# Patient Record
Sex: Female | Born: 1955 | Race: Black or African American | Hispanic: No | Marital: Married | Smoking: Never smoker
Health system: Southern US, Community
[De-identification: ages and names within clinical notes are randomized; demographics above are authoritative.]

## PROBLEM LIST (undated history)

## (undated) DIAGNOSIS — I639 Cerebral infarction, unspecified: Secondary | ICD-10-CM

## (undated) DIAGNOSIS — I10 Essential (primary) hypertension: Secondary | ICD-10-CM

## (undated) HISTORY — DX: Essential (primary) hypertension: I10

## (undated) HISTORY — DX: Cerebral infarction, unspecified: I63.9

## (undated) HISTORY — PX: PARTIAL HYSTERECTOMY: SHX80

---

## 2008-04-11 ENCOUNTER — Ambulatory Visit (HOSPITAL_COMMUNITY): Admission: RE | Admit: 2008-04-11 | Discharge: 2008-04-11 | Payer: Self-pay | Admitting: Internal Medicine

## 2011-07-28 ENCOUNTER — Other Ambulatory Visit (HOSPITAL_COMMUNITY): Payer: Self-pay | Admitting: Internal Medicine

## 2011-07-28 DIAGNOSIS — Z139 Encounter for screening, unspecified: Secondary | ICD-10-CM

## 2011-08-04 ENCOUNTER — Ambulatory Visit (HOSPITAL_COMMUNITY)
Admission: RE | Admit: 2011-08-04 | Discharge: 2011-08-04 | Disposition: A | Discharge status: Home / Self Care | Payer: PRIVATE HEALTH INSURANCE | Source: Ambulatory Visit | Attending: Internal Medicine | Admitting: Internal Medicine

## 2011-08-04 DIAGNOSIS — Z1231 Encounter for screening mammogram for malignant neoplasm of breast: Secondary | ICD-10-CM | POA: Insufficient documentation

## 2011-08-04 DIAGNOSIS — Z139 Encounter for screening, unspecified: Secondary | ICD-10-CM

## 2011-08-15 ENCOUNTER — Other Ambulatory Visit: Payer: Self-pay | Admitting: Internal Medicine

## 2011-08-15 DIAGNOSIS — R928 Other abnormal and inconclusive findings on diagnostic imaging of breast: Secondary | ICD-10-CM

## 2011-08-19 ENCOUNTER — Other Ambulatory Visit: Payer: Self-pay | Admitting: Internal Medicine

## 2011-08-19 DIAGNOSIS — R928 Other abnormal and inconclusive findings on diagnostic imaging of breast: Secondary | ICD-10-CM

## 2011-09-02 ENCOUNTER — Ambulatory Visit
Admission: RE | Admit: 2011-09-02 | Discharge: 2011-09-02 | Disposition: A | Discharge status: Home / Self Care | Payer: PRIVATE HEALTH INSURANCE | Source: Ambulatory Visit | Attending: Internal Medicine | Admitting: Internal Medicine

## 2011-09-02 DIAGNOSIS — R928 Other abnormal and inconclusive findings on diagnostic imaging of breast: Secondary | ICD-10-CM

## 2012-08-31 ENCOUNTER — Other Ambulatory Visit: Payer: Self-pay | Admitting: Internal Medicine

## 2012-08-31 DIAGNOSIS — N6310 Unspecified lump in the right breast, unspecified quadrant: Secondary | ICD-10-CM

## 2012-09-09 ENCOUNTER — Ambulatory Visit
Admission: RE | Admit: 2012-09-09 | Discharge: 2012-09-09 | Disposition: A | Discharge status: Home / Self Care | Payer: PRIVATE HEALTH INSURANCE | Source: Ambulatory Visit | Attending: Internal Medicine | Admitting: Internal Medicine

## 2012-09-09 DIAGNOSIS — N6310 Unspecified lump in the right breast, unspecified quadrant: Secondary | ICD-10-CM

## 2015-03-06 ENCOUNTER — Ambulatory Visit (HOSPITAL_COMMUNITY)
Admission: RE | Admit: 2015-03-06 | Discharge: 2015-03-06 | Disposition: A | Discharge status: Home / Self Care | Payer: PRIVATE HEALTH INSURANCE | Source: Ambulatory Visit | Attending: Internal Medicine | Admitting: Internal Medicine

## 2015-03-06 ENCOUNTER — Other Ambulatory Visit (HOSPITAL_COMMUNITY): Payer: Self-pay | Admitting: Internal Medicine

## 2015-03-06 DIAGNOSIS — M25572 Pain in left ankle and joints of left foot: Secondary | ICD-10-CM | POA: Diagnosis not present

## 2015-03-06 DIAGNOSIS — R609 Edema, unspecified: Secondary | ICD-10-CM | POA: Diagnosis not present

## 2015-03-06 DIAGNOSIS — M259 Joint disorder, unspecified: Secondary | ICD-10-CM

## 2015-11-15 ENCOUNTER — Other Ambulatory Visit: Payer: Self-pay | Admitting: Internal Medicine

## 2015-11-15 DIAGNOSIS — N63 Unspecified lump in unspecified breast: Secondary | ICD-10-CM

## 2015-11-15 DIAGNOSIS — Z1231 Encounter for screening mammogram for malignant neoplasm of breast: Secondary | ICD-10-CM

## 2015-11-23 ENCOUNTER — Ambulatory Visit
Admission: RE | Admit: 2015-11-23 | Discharge: 2015-11-23 | Disposition: A | Discharge status: Home / Self Care | Payer: PRIVATE HEALTH INSURANCE | Source: Ambulatory Visit | Attending: Internal Medicine | Admitting: Internal Medicine

## 2015-11-23 DIAGNOSIS — N63 Unspecified lump in unspecified breast: Secondary | ICD-10-CM

## 2016-09-10 ENCOUNTER — Other Ambulatory Visit (HOSPITAL_COMMUNITY): Payer: Self-pay | Admitting: Internal Medicine

## 2016-09-10 DIAGNOSIS — K7689 Other specified diseases of liver: Secondary | ICD-10-CM

## 2016-09-18 ENCOUNTER — Ambulatory Visit (HOSPITAL_COMMUNITY)
Admission: RE | Admit: 2016-09-18 | Discharge: 2016-09-18 | Disposition: A | Discharge status: Home / Self Care | Payer: PRIVATE HEALTH INSURANCE | Source: Ambulatory Visit | Attending: Internal Medicine | Admitting: Internal Medicine

## 2016-09-18 DIAGNOSIS — K7689 Other specified diseases of liver: Secondary | ICD-10-CM

## 2016-10-02 ENCOUNTER — Encounter (INDEPENDENT_AMBULATORY_CARE_PROVIDER_SITE_OTHER): Payer: Self-pay | Admitting: Internal Medicine

## 2016-10-08 ENCOUNTER — Ambulatory Visit (INDEPENDENT_AMBULATORY_CARE_PROVIDER_SITE_OTHER): Payer: PRIVATE HEALTH INSURANCE | Admitting: Internal Medicine

## 2016-10-08 ENCOUNTER — Encounter (INDEPENDENT_AMBULATORY_CARE_PROVIDER_SITE_OTHER): Payer: Self-pay | Admitting: Internal Medicine

## 2016-10-08 VITALS — BP 162/92 | HR 64 | Temp 97.7°F | Ht 62.0 in | Wt 153.3 lb

## 2016-10-08 DIAGNOSIS — R748 Abnormal levels of other serum enzymes: Secondary | ICD-10-CM | POA: Diagnosis not present

## 2016-10-08 DIAGNOSIS — I10 Essential (primary) hypertension: Secondary | ICD-10-CM

## 2016-10-08 HISTORY — DX: Essential (primary) hypertension: I10

## 2016-10-08 NOTE — Patient Instructions (Signed)
Hepatic function in 2 weeks. 

## 2016-10-08 NOTE — Progress Notes (Signed)
   Subjective:    Patient ID: Katrina Hodge, female    DOB: 09/26/1956, 61 y.o.   MRN: [id number]  HPI   Referred by Dr. Sherwood GamblerFusco for elevated liver enzymes. She denies past hx of elevated liver enzymes. She is not exercising. She is ready to start back exercising.  She has done Yoga in the past.  Liver enzymes slightly elevated with negative hepatitis panel.   09/09/2016 AST 52, ALT 34, ALP 79, total bili 0.6 Iron 91, ferritin 125, TIBC 307, vitamin B12 585, UIBC 216. Acute Hepatitis Panel negative. Albumin 4.9 Hemoglobin 14.9  09/18/2016 US abdomen:    IMPRESSION: Diffusely echogenic liver consistent with hepatic parenchymal disease. This could be steatosis or other hepatocellular pathology. No evidence of focal lesion or ductal dilatation. No evidence of gallbladder disease.   Right kidney smaller than the left, presumably chronic.         Review of Systems Past Medical History:  Diagnosis Date  . Essential hypertension 10/08/2016    Past Surgical History:  Procedure Laterality Date  . PARTIAL HYSTERECTOMY     fibroid tumors    Allergies  Allergen Reactions  . Levaquin [Levofloxacin In D5w]     rash    No current outpatient prescriptions on file prior to visit.   No current facility-administered medications on file prior to visit.    Current Outpatient Prescriptions  Medication Sig Dispense Refill  . atenolol (TENORMIN) 100 MG tablet Take 100 mg by mouth daily.    Marland Kitchen. ibuprofen (ADVIL,MOTRIN) 200 MG tablet Take 200 mg by mouth every 6 (six) hours as needed.    . lactobacillus acidophilus (BACID) TABS tablet Take 2 tablets by mouth 3 (three) times daily.    . Multiple Vitamin (MULTIVITAMIN) tablet Take 1 tablet by mouth as needed.     No current facility-administered medications for this visit.         Objective:   Physical Exam Blood pressure (!) 162/92, pulse 64, temperature 97.7 F (36.5 C), height 5\' 2"  (1.575 m), weight 153 lb 4.8 oz (69.5  kg).  Alert and oriented. Skin warm and dry. Oral mucosa is moist.   . Sclera anicteric, conjunctivae is pink. Thyroid not enlarged. No cervical lymphadenopathy. Lungs clear. Heart regular rate and rhythm.  Abdomen is soft. Bowel sounds are positive. No hepatomegaly. No abdominal masses felt. No tenderness.  No edema to lower extremities.       Assessment & Plan:  Elevated liver enzymes. Will repeat in 2 weeks. Further recommendations to follow

## 2016-10-14 ENCOUNTER — Encounter (INDEPENDENT_AMBULATORY_CARE_PROVIDER_SITE_OTHER): Payer: Self-pay

## 2016-11-05 ENCOUNTER — Telehealth (INDEPENDENT_AMBULATORY_CARE_PROVIDER_SITE_OTHER): Payer: Self-pay | Admitting: Internal Medicine

## 2016-11-05 LAB — HEPATIC FUNCTION PANEL
ALT: 21 U/L (ref 6–29)
AST: 19 U/L (ref 10–35)
Albumin: 4.2 g/dL (ref 3.6–5.1)
Alkaline Phosphatase: 76 U/L (ref 33–130)
BILIRUBIN DIRECT: 0.1 mg/dL (ref <=0.2)
BILIRUBIN INDIRECT: 0.4 mg/dL (ref 0.2–1.2)
Total Bilirubin: 0.5 mg/dL (ref 0.2–1.2)
Total Protein: 7.9 g/dL (ref 6.1–8.1)

## 2016-11-05 NOTE — Telephone Encounter (Signed)
Hepatic function in 3 months.

## 2016-11-11 ENCOUNTER — Other Ambulatory Visit (INDEPENDENT_AMBULATORY_CARE_PROVIDER_SITE_OTHER): Payer: Self-pay | Admitting: *Deleted

## 2016-11-11 DIAGNOSIS — R7401 Elevation of levels of liver transaminase levels: Secondary | ICD-10-CM

## 2016-11-11 DIAGNOSIS — R740 Nonspecific elevation of levels of transaminase and lactic acid dehydrogenase [LDH]: Principal | ICD-10-CM

## 2016-11-11 NOTE — Telephone Encounter (Signed)
Lab is noted and a letter will be sent to the patient as a reminder.

## 2017-01-06 ENCOUNTER — Ambulatory Visit (INDEPENDENT_AMBULATORY_CARE_PROVIDER_SITE_OTHER): Payer: PRIVATE HEALTH INSURANCE | Admitting: Internal Medicine

## 2017-01-06 ENCOUNTER — Encounter (INDEPENDENT_AMBULATORY_CARE_PROVIDER_SITE_OTHER): Payer: Self-pay

## 2017-01-06 ENCOUNTER — Encounter (INDEPENDENT_AMBULATORY_CARE_PROVIDER_SITE_OTHER): Payer: Self-pay | Admitting: Internal Medicine

## 2017-01-06 VITALS — BP 178/90 | HR 72 | Temp 98.0°F | Ht 62.0 in | Wt 154.2 lb

## 2017-01-06 DIAGNOSIS — K760 Fatty (change of) liver, not elsewhere classified: Secondary | ICD-10-CM

## 2017-01-06 DIAGNOSIS — R748 Abnormal levels of other serum enzymes: Secondary | ICD-10-CM

## 2017-01-06 LAB — HEPATIC FUNCTION PANEL
ALBUMIN: 4.2 g/dL (ref 3.6–5.1)
ALT: 21 U/L (ref 6–29)
AST: 18 U/L (ref 10–35)
Alkaline Phosphatase: 83 U/L (ref 33–130)
BILIRUBIN INDIRECT: 0.4 mg/dL (ref 0.2–1.2)
Bilirubin, Direct: 0.1 mg/dL (ref <=0.2)
TOTAL PROTEIN: 7.6 g/dL (ref 6.1–8.1)
Total Bilirubin: 0.5 mg/dL (ref 0.2–1.2)

## 2017-01-06 NOTE — Patient Instructions (Addendum)
OV in 6 months. Hepatic function today 

## 2017-01-06 NOTE — Progress Notes (Signed)
   Subjective:    Patient ID: Katrina Hodge, female    DOB: [birth year], 61 y.o.   MRN: [id number] Wt 153 HPI  Here today for f/u. She was last seen in January for elevated enzymes. She tells me she is doing great. She is tired. She says she needs to slow down. Her appetite is good. No weight loss. She exercises off and on. She usually has a BM every day. Tries to eat healthy.  Trying to avoid fatty foods.   Hepatic Function Latest Ref Rng & Units 11/05/2016  Total Protein 6.1 - 8.1 g/dL 7.9  Albumin 3.6 - 5.1 g/dL 4.2  AST 10 - 35 U/L 19  ALT 6 - 29 U/L 21  Alk Phosphatase 33 - 130 U/L 76  Total Bilirubin 0.2 - 1.2 mg/dL 0.5  Bilirubin, Direct <=0.2 mg/dL 0.1     09/09/2016 AST 52, ALT 34, ALP 79, total bili 0.6 Iron 91, ferritin 125, TIBC 307, vitamin B12 585, UIBC 216. Acute Hepatitis Panel negative. Albumin 4.9 Hemoglobin 14.9  09/18/2016 US abdomen:   IMPRESSION: Diffusely echogenic liver consistent with hepatic parenchymal disease. This could be steatosis or other hepatocellular pathology. No evidence of focal lesion or ductal dilatation. No evidence of gallbladder disease.    Review of Systems Past Medical History:  Diagnosis Date  . Essential hypertension 10/08/2016    Past Surgical History:  Procedure Laterality Date  . PARTIAL HYSTERECTOMY     fibroid tumors    Allergies  Allergen Reactions  . Levaquin [Levofloxacin In D5w]     rash    Current Outpatient Prescriptions on File Prior to Visit  Medication Sig Dispense Refill  . atenolol (TENORMIN) 100 MG tablet Take 100 mg by mouth daily.    Marland Kitchen ibuprofen (ADVIL,MOTRIN) 200 MG tablet Take 200 mg by mouth every 6 (six) hours as needed.    . lactobacillus acidophilus (BACID) TABS tablet Take 2 tablets by mouth 3 (three) times daily.    . Multiple Vitamin (MULTIVITAMIN) tablet Take 1 tablet by mouth as needed.     No current facility-administered medications on file prior to visit.        Objective:    Physical Exam Blood pressure (!) 178/90, pulse 72, temperature 98 F (36.7 C), height '5\' 2"'$  (1.575 m), weight 154 lb 3.2 oz (69.9 kg). Alert and oriented. Skin warm and dry. Oral mucosa is moist.   . Sclera anicteric, conjunctivae is pink. Thyroid not enlarged. No cervical lymphadenopathy. Lungs clear. Heart regular rate and rhythm.  Abdomen is soft. Bowel sounds are positive. No hepatomegaly. No abdominal masses felt. No tenderness.  No edema to lower extremities.          Assessment & Plan:  Elevated liver enzymes. Enzymes in January were normal. Fatty liver. Needs to diet and exercise. Hepatic today. OV in 6 months.

## 2017-01-30 ENCOUNTER — Other Ambulatory Visit (INDEPENDENT_AMBULATORY_CARE_PROVIDER_SITE_OTHER): Payer: Self-pay | Admitting: *Deleted

## 2017-01-30 ENCOUNTER — Encounter (INDEPENDENT_AMBULATORY_CARE_PROVIDER_SITE_OTHER): Payer: Self-pay | Admitting: *Deleted

## 2017-01-30 DIAGNOSIS — R740 Nonspecific elevation of levels of transaminase and lactic acid dehydrogenase [LDH]: Principal | ICD-10-CM

## 2017-01-30 DIAGNOSIS — R7401 Elevation of levels of liver transaminase levels: Secondary | ICD-10-CM

## 2017-02-17 ENCOUNTER — Telehealth (INDEPENDENT_AMBULATORY_CARE_PROVIDER_SITE_OTHER): Payer: Self-pay | Admitting: *Deleted

## 2017-02-17 NOTE — Telephone Encounter (Signed)
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 Please call [phone] or work number [phone]

## 2017-02-18 NOTE — Telephone Encounter (Signed)
Patient was called. Advised her that she could get those labs done,as it includes what we are wanting. I ask that she fax the results to us.

## 2017-05-06 ENCOUNTER — Encounter (INDEPENDENT_AMBULATORY_CARE_PROVIDER_SITE_OTHER): Payer: Self-pay | Admitting: *Deleted

## 2017-06-02 ENCOUNTER — Other Ambulatory Visit: Payer: Self-pay | Admitting: Internal Medicine

## 2017-06-02 DIAGNOSIS — Z1231 Encounter for screening mammogram for malignant neoplasm of breast: Secondary | ICD-10-CM

## 2017-06-04 ENCOUNTER — Encounter (INDEPENDENT_AMBULATORY_CARE_PROVIDER_SITE_OTHER): Payer: Self-pay

## 2017-06-04 ENCOUNTER — Ambulatory Visit
Admission: RE | Admit: 2017-06-04 | Discharge: 2017-06-04 | Disposition: A | Discharge status: Home / Self Care | Payer: PRIVATE HEALTH INSURANCE | Source: Ambulatory Visit | Attending: Internal Medicine | Admitting: Internal Medicine

## 2017-06-04 DIAGNOSIS — Z1231 Encounter for screening mammogram for malignant neoplasm of breast: Secondary | ICD-10-CM

## 2017-07-08 ENCOUNTER — Ambulatory Visit (INDEPENDENT_AMBULATORY_CARE_PROVIDER_SITE_OTHER): Payer: PRIVATE HEALTH INSURANCE | Admitting: Internal Medicine

## 2017-07-08 ENCOUNTER — Encounter (INDEPENDENT_AMBULATORY_CARE_PROVIDER_SITE_OTHER): Payer: Self-pay | Admitting: Internal Medicine

## 2017-09-23 ENCOUNTER — Encounter (INDEPENDENT_AMBULATORY_CARE_PROVIDER_SITE_OTHER): Payer: Self-pay | Admitting: *Deleted

## 2017-09-23 ENCOUNTER — Encounter (INDEPENDENT_AMBULATORY_CARE_PROVIDER_SITE_OTHER): Payer: Self-pay

## 2018-06-09 ENCOUNTER — Other Ambulatory Visit: Payer: Self-pay | Admitting: Internal Medicine

## 2018-06-09 DIAGNOSIS — Z1231 Encounter for screening mammogram for malignant neoplasm of breast: Secondary | ICD-10-CM

## 2018-06-15 ENCOUNTER — Ambulatory Visit
Admission: RE | Admit: 2018-06-15 | Discharge: 2018-06-15 | Disposition: A | Discharge status: Home / Self Care | Payer: PRIVATE HEALTH INSURANCE | Source: Ambulatory Visit | Attending: Internal Medicine | Admitting: Internal Medicine

## 2018-06-15 DIAGNOSIS — Z1231 Encounter for screening mammogram for malignant neoplasm of breast: Secondary | ICD-10-CM

## 2018-08-02 ENCOUNTER — Encounter (INDEPENDENT_AMBULATORY_CARE_PROVIDER_SITE_OTHER): Payer: Self-pay | Admitting: *Deleted

## 2018-08-16 ENCOUNTER — Encounter (INDEPENDENT_AMBULATORY_CARE_PROVIDER_SITE_OTHER): Payer: Self-pay | Admitting: *Deleted

## 2018-09-08 ENCOUNTER — Other Ambulatory Visit (INDEPENDENT_AMBULATORY_CARE_PROVIDER_SITE_OTHER): Payer: Self-pay | Admitting: *Deleted

## 2018-09-08 DIAGNOSIS — Z1211 Encounter for screening for malignant neoplasm of colon: Secondary | ICD-10-CM | POA: Insufficient documentation

## 2018-10-11 ENCOUNTER — Encounter (INDEPENDENT_AMBULATORY_CARE_PROVIDER_SITE_OTHER): Payer: Self-pay | Admitting: *Deleted

## 2018-10-11 ENCOUNTER — Telehealth (INDEPENDENT_AMBULATORY_CARE_PROVIDER_SITE_OTHER): Payer: Self-pay | Admitting: *Deleted

## 2018-10-11 NOTE — Telephone Encounter (Signed)
Patient needs suprep 

## 2018-10-12 MED ORDER — SUPREP BOWEL PREP KIT 17.5-3.13-1.6 GM/177ML PO SOLN: 1.0000 | Freq: Once | ORAL | 0 refills | Status: AC

## 2018-10-22 ENCOUNTER — Telehealth (INDEPENDENT_AMBULATORY_CARE_PROVIDER_SITE_OTHER): Payer: Self-pay | Admitting: *Deleted

## 2018-10-22 NOTE — Telephone Encounter (Signed)
Referring MD/PCP: fusco   Procedure: tcs  Reason/Indication:  screening  Has patient had this procedure before?  Yes, 2009  If so, when, by whom and where?    Is there a family history of colon cancer?  no  Who?  What age when diagnosed?    Is patient diabetic?   no      Does patient have prosthetic heart valve or mechanical valve?  no  Do you have a pacemaker?  no  Has patient ever had endocarditis? no  Has patient had joint replacement within last 12 months?  no  Is patient constipated or do they take laxatives? no  Does patient have a history of alcohol/drug use?  no  Is patient on blood thinner such as Coumadin, Plavix and/or Aspirin? yes  Medications: asa 81 mg daily, hctz 25 mg daily, vit b12 daily, probiotics daily, omega 3 daily  Allergies: nkda  Medication Adjustment per Dr Keane Police, NP: asa 2 days  Procedure date & time: 11/18/18 at 1030

## 2018-10-26 NOTE — Telephone Encounter (Signed)
agree

## 2018-11-18 ENCOUNTER — Ambulatory Visit (HOSPITAL_COMMUNITY)
Admission: RE | Admit: 2018-11-18 | Payer: PRIVATE HEALTH INSURANCE | Source: Home / Self Care | Admitting: Internal Medicine

## 2018-11-18 ENCOUNTER — Encounter (HOSPITAL_COMMUNITY): Admission: RE | Payer: Self-pay | Source: Home / Self Care

## 2018-11-18 SURGERY — COLONOSCOPY
Anesthesia: Moderate Sedation

## 2019-08-08 ENCOUNTER — Other Ambulatory Visit: Payer: Self-pay | Admitting: Internal Medicine

## 2019-08-08 DIAGNOSIS — Z1231 Encounter for screening mammogram for malignant neoplasm of breast: Secondary | ICD-10-CM

## 2019-08-09 ENCOUNTER — Ambulatory Visit
Admission: RE | Admit: 2019-08-09 | Discharge: 2019-08-09 | Disposition: A | Discharge status: Home / Self Care | Payer: PRIVATE HEALTH INSURANCE | Source: Ambulatory Visit | Attending: Internal Medicine | Admitting: Internal Medicine

## 2019-08-09 ENCOUNTER — Other Ambulatory Visit: Payer: Self-pay

## 2019-08-09 DIAGNOSIS — Z1231 Encounter for screening mammogram for malignant neoplasm of breast: Secondary | ICD-10-CM

## 2019-12-10 ENCOUNTER — Ambulatory Visit: Payer: PRIVATE HEALTH INSURANCE | Attending: Internal Medicine

## 2019-12-10 DIAGNOSIS — Z23 Encounter for immunization: Secondary | ICD-10-CM

## 2019-12-10 NOTE — Progress Notes (Signed)
   Covid-19 Vaccination Clinic  Name:  Katrina Hodge    MRN: [id number] DOB: April 05, 1956  12/10/2019  [name] was observed post Covid-19 immunization for 15 minutes without incident. She was provided with Vaccine Information Sheet and instruction to access the V-Safe system.   [name] was instructed to call 911 with any severe reactions post vaccine: Marland Kitchen Difficulty breathing  . Swelling of face and throat  . A fast heartbeat  . A bad rash all over body  . Dizziness and weakness   Immunizations Administered    Name Date Dose VIS Date Route   Moderna COVID-19 Vaccine 12/10/2019  9:26 AM 0.5 mL 09/06/2019 Intramuscular   Manufacturer: Moderna   Lot: [id number]   NDC: 85885-027-74

## 2020-01-11 ENCOUNTER — Ambulatory Visit: Payer: PRIVATE HEALTH INSURANCE | Attending: Internal Medicine

## 2020-01-11 DIAGNOSIS — Z23 Encounter for immunization: Secondary | ICD-10-CM

## 2020-01-11 NOTE — Progress Notes (Signed)
   Covid-19 Vaccination Clinic  Name:  Katrina Hodge    MRN: [id number] DOB: 01-21-1956  01/11/2020  [name] was observed post Covid-19 immunization for 15 minutes without incident. She was provided with Vaccine Information Sheet and instruction to access the V-Safe system.   [name] was instructed to call 911 with any severe reactions post vaccine: Marland Kitchen Difficulty breathing  . Swelling of face and throat  . A fast heartbeat  . A bad rash all over body  . Dizziness and weakness   Immunizations Administered    Name Date Dose VIS Date Route   Moderna COVID-19 Vaccine 01/11/2020 11:29 AM 0.5 mL 09/06/2019 Intramuscular   Manufacturer: Gala Murdoch   Lot: [id number]   NDC: 10254-862-82

## 2020-01-12 ENCOUNTER — Ambulatory Visit: Payer: PRIVATE HEALTH INSURANCE

## 2020-06-18 ENCOUNTER — Other Ambulatory Visit: Payer: Self-pay | Admitting: Internal Medicine

## 2020-06-18 DIAGNOSIS — Z1231 Encounter for screening mammogram for malignant neoplasm of breast: Secondary | ICD-10-CM

## 2020-08-09 ENCOUNTER — Ambulatory Visit
Admission: RE | Admit: 2020-08-09 | Discharge: 2020-08-09 | Disposition: A | Discharge status: Home / Self Care | Payer: PRIVATE HEALTH INSURANCE | Source: Ambulatory Visit | Attending: Internal Medicine | Admitting: Internal Medicine

## 2020-08-09 ENCOUNTER — Other Ambulatory Visit: Payer: Self-pay

## 2020-08-09 DIAGNOSIS — Z1231 Encounter for screening mammogram for malignant neoplasm of breast: Secondary | ICD-10-CM

## 2021-07-23 ENCOUNTER — Other Ambulatory Visit (HOSPITAL_COMMUNITY): Payer: Self-pay

## 2021-07-23 ENCOUNTER — Other Ambulatory Visit: Payer: Self-pay | Admitting: Internal Medicine

## 2021-07-23 DIAGNOSIS — Z1231 Encounter for screening mammogram for malignant neoplasm of breast: Secondary | ICD-10-CM

## 2021-07-23 MED ORDER — INFLUENZA VAC SPLIT QUAD 0.5 ML IM SUSY
PREFILLED_SYRINGE | INTRAMUSCULAR | 0 refills | Status: DC
  Filled 2021-07-23: qty 0.5, 1d supply, fill #0

## 2021-08-22 ENCOUNTER — Other Ambulatory Visit: Payer: Self-pay

## 2021-08-22 ENCOUNTER — Ambulatory Visit
Admission: RE | Admit: 2021-08-22 | Discharge: 2021-08-22 | Disposition: A | Discharge status: Home / Self Care | Payer: No Typology Code available for payment source | Source: Ambulatory Visit | Attending: Internal Medicine | Admitting: Internal Medicine

## 2021-08-22 DIAGNOSIS — Z1231 Encounter for screening mammogram for malignant neoplasm of breast: Secondary | ICD-10-CM

## 2022-08-07 ENCOUNTER — Other Ambulatory Visit: Payer: Self-pay | Admitting: Internal Medicine

## 2022-08-07 DIAGNOSIS — Z1231 Encounter for screening mammogram for malignant neoplasm of breast: Secondary | ICD-10-CM

## 2022-09-03 ENCOUNTER — Ambulatory Visit
Admission: RE | Admit: 2022-09-03 | Discharge: 2022-09-03 | Disposition: A | Discharge status: Home / Self Care | Payer: PRIVATE HEALTH INSURANCE | Source: Ambulatory Visit | Attending: Internal Medicine | Admitting: Internal Medicine

## 2022-09-03 DIAGNOSIS — Z1231 Encounter for screening mammogram for malignant neoplasm of breast: Secondary | ICD-10-CM

## 2022-10-07 ENCOUNTER — Ambulatory Visit: Payer: No Typology Code available for payment source

## 2023-05-20 IMAGING — MG MM DIGITAL SCREENING BILAT W/ TOMO AND CAD
8 series · 8 of 24 positions shown · non-contrast
Comparison: Previous exam(s).

CLINICAL DATA: Screening.

EXAM:
DIGITAL SCREENING BILATERAL MAMMOGRAM WITH TOMOSYNTHESIS AND CAD
TECHNIQUE: Bilateral screening digital craniocaudal and mediolateral oblique
mammograms were obtained. Bilateral screening digital breast
tomosynthesis was performed. The images were evaluated with
computer-aided detection.

[R MLO synth-2D]
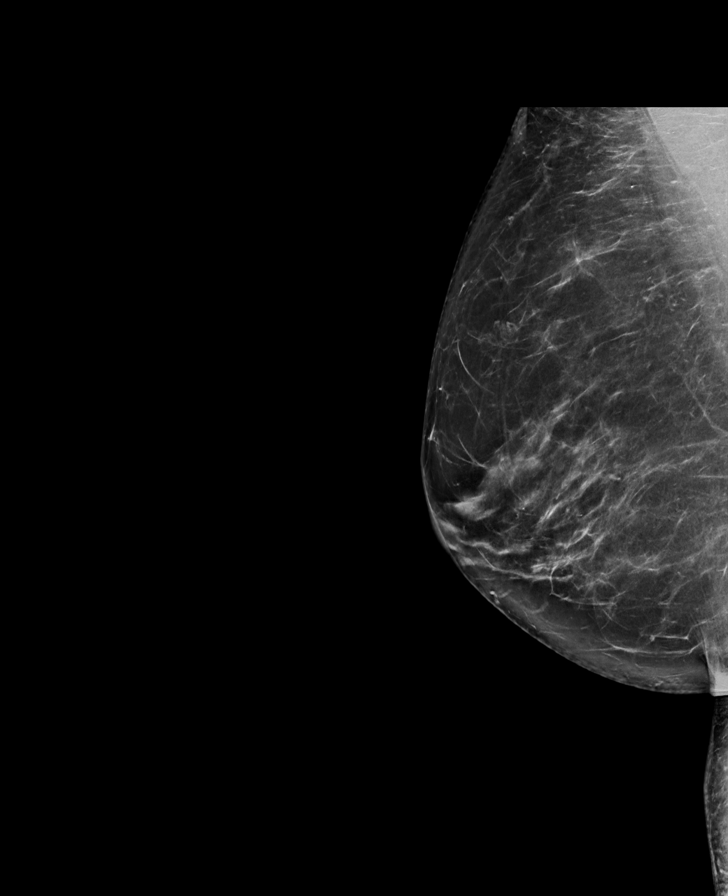

[L MLO synth-2D]
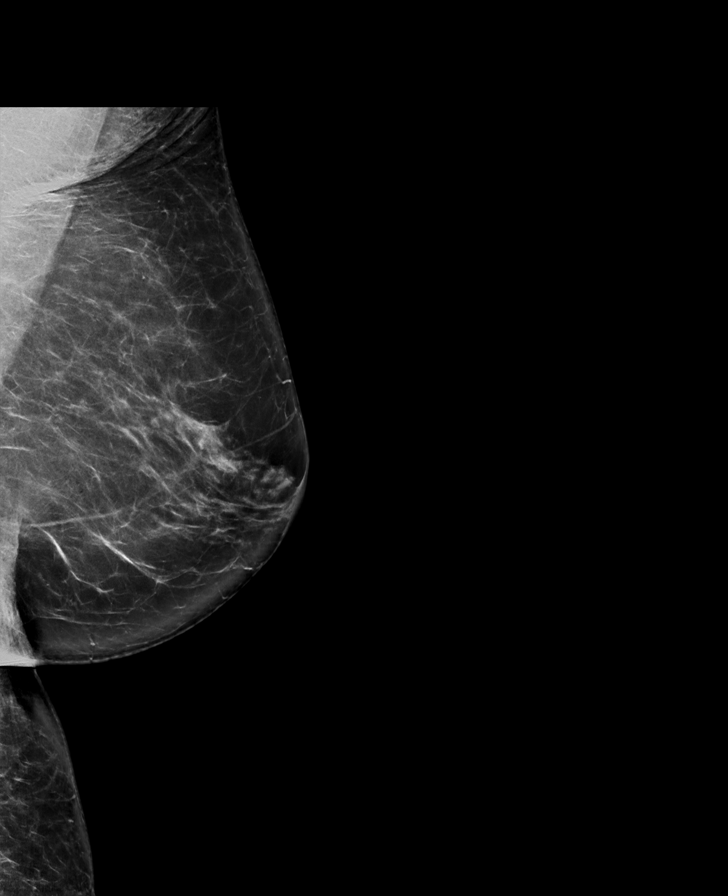

[L CC synth-2D]
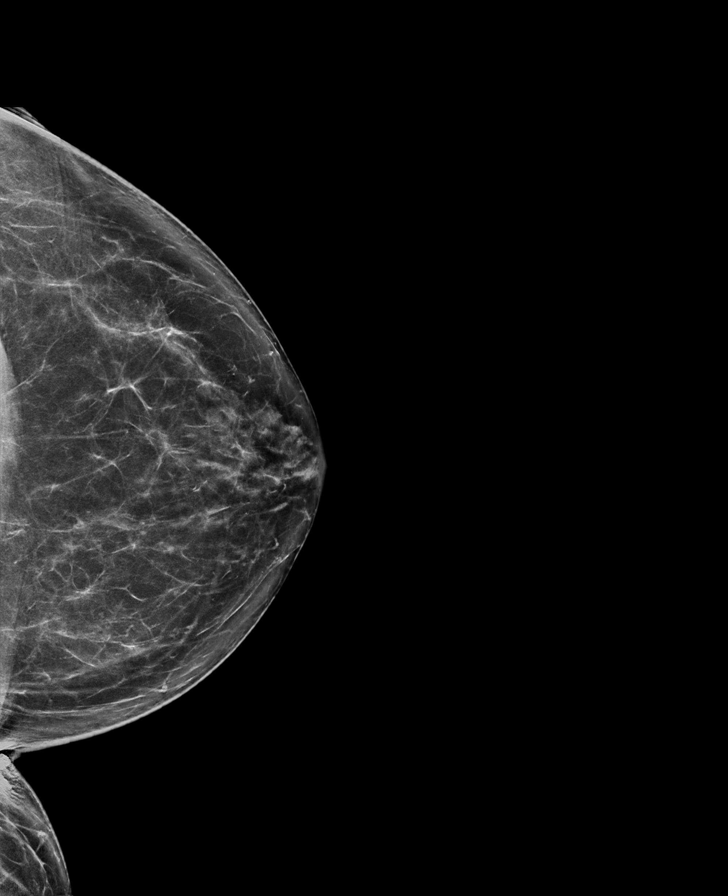

[R CC synth-2D]
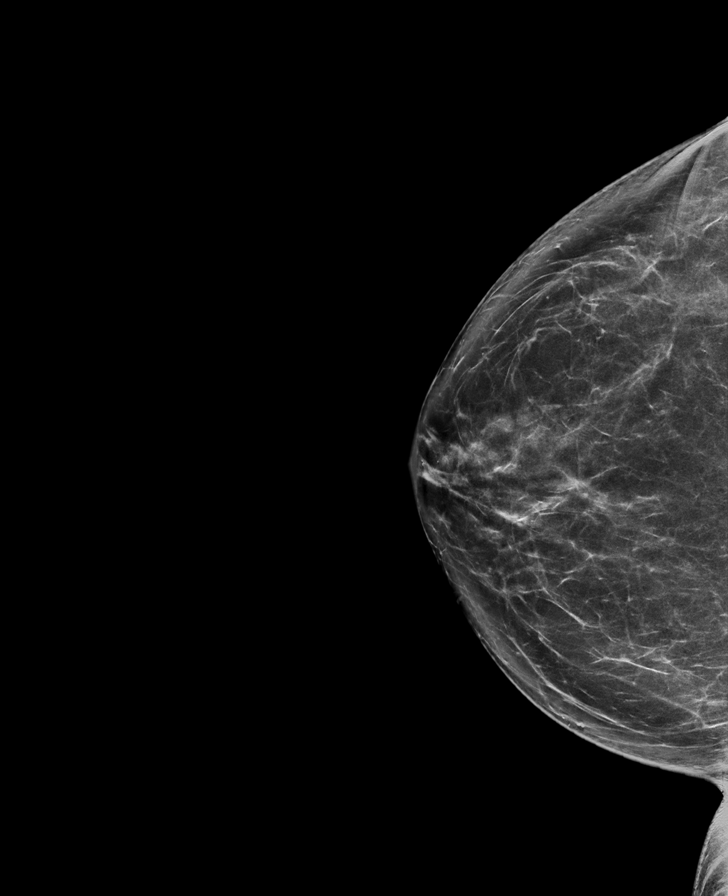

[R MLO tomo · tomo slice 46/91.0]
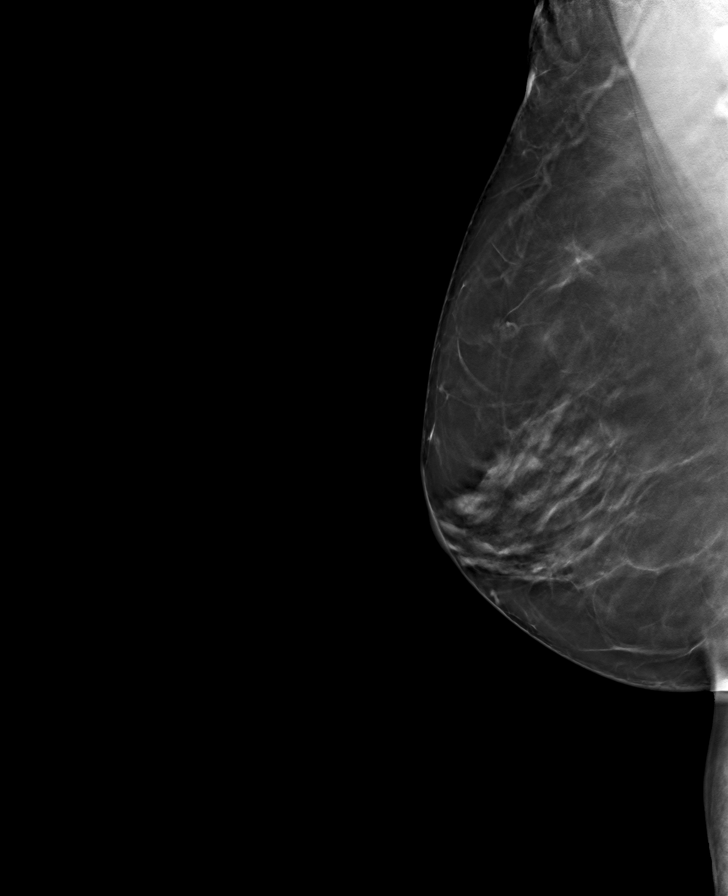

[R CC tomo · tomo slice 41/80.0]
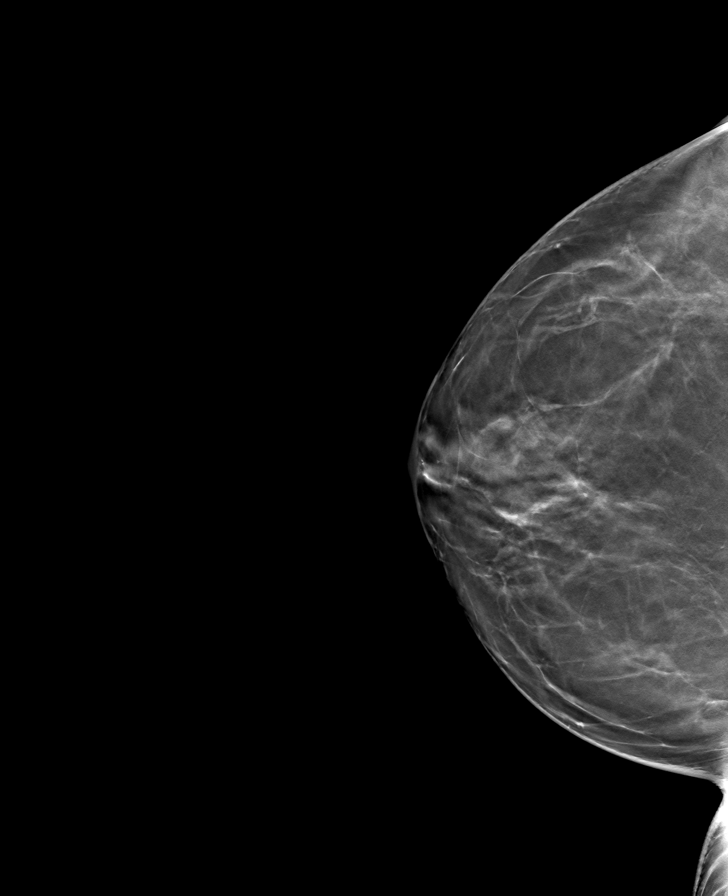

[L MLO tomo · tomo slice 43/86.0]
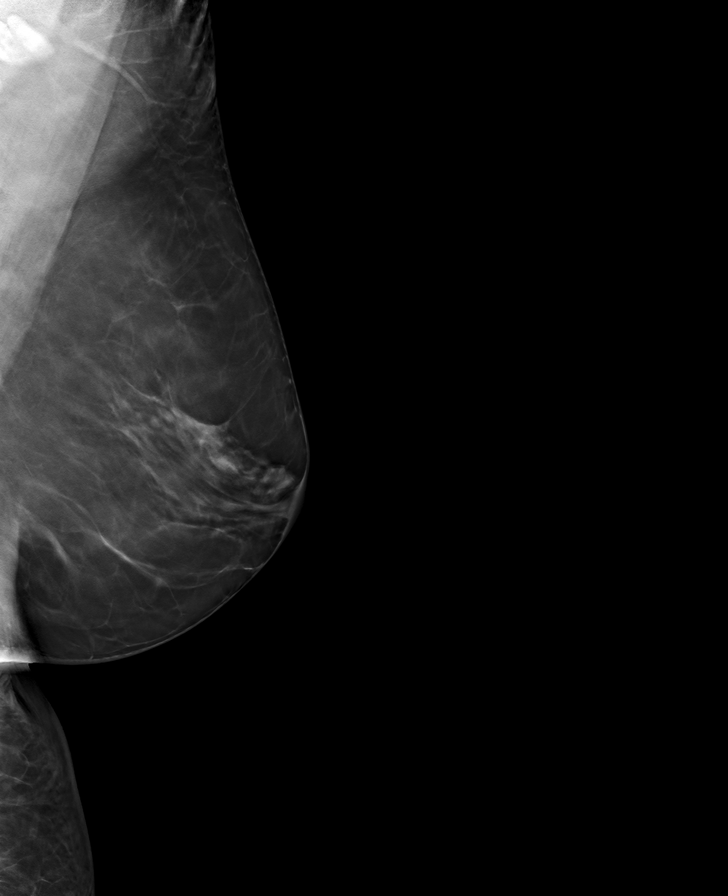

[L CC tomo · tomo slice 39/78.0]
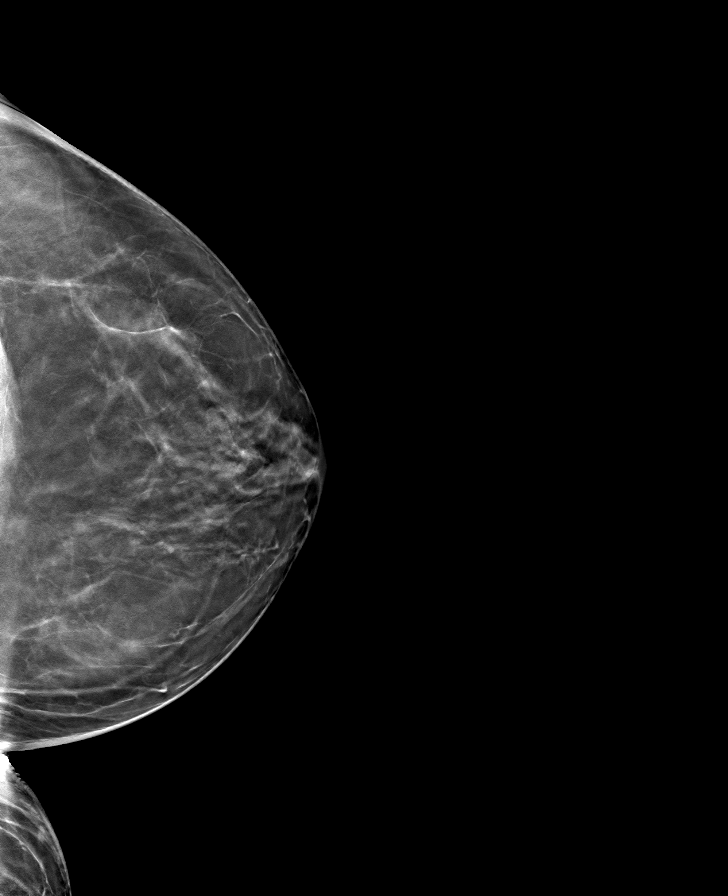

[8 of 24 positions shown; findings below may reference images not displayed]

ACR Breast Density Category b: There are scattered areas of
fibroglandular density.
FINDINGS: There are no findings suspicious for malignancy.
IMPRESSION: No mammographic evidence of malignancy. A result letter of this
screening mammogram will be mailed directly to the patient.

RECOMMENDATION:
Screening mammogram in one year. (Code:51-O-LD2)

BI-RADS CATEGORY  1: Negative.

## 2023-06-25 ENCOUNTER — Emergency Department (HOSPITAL_COMMUNITY): Payer: No Typology Code available for payment source

## 2023-06-25 ENCOUNTER — Observation Stay (HOSPITAL_COMMUNITY): Payer: No Typology Code available for payment source

## 2023-06-25 ENCOUNTER — Encounter (HOSPITAL_COMMUNITY): Payer: Self-pay | Admitting: Emergency Medicine

## 2023-06-25 ENCOUNTER — Observation Stay (HOSPITAL_COMMUNITY)
Admission: EM | Admit: 2023-06-25 | Discharge: 2023-06-27 | Disposition: A | Discharge status: Home / Self Care | Payer: No Typology Code available for payment source | Attending: Internal Medicine | Admitting: Internal Medicine

## 2023-06-25 ENCOUNTER — Other Ambulatory Visit: Payer: Self-pay

## 2023-06-25 DIAGNOSIS — I639 Cerebral infarction, unspecified: Secondary | ICD-10-CM | POA: Diagnosis not present

## 2023-06-25 DIAGNOSIS — Z79899 Other long term (current) drug therapy: Secondary | ICD-10-CM | POA: Diagnosis not present

## 2023-06-25 DIAGNOSIS — I10 Essential (primary) hypertension: Secondary | ICD-10-CM | POA: Diagnosis present

## 2023-06-25 DIAGNOSIS — E0800 Diabetes mellitus due to underlying condition with hyperosmolarity without nonketotic hyperglycemic-hyperosmolar coma (NKHHC): Principal | ICD-10-CM

## 2023-06-25 DIAGNOSIS — E119 Type 2 diabetes mellitus without complications: Secondary | ICD-10-CM | POA: Insufficient documentation

## 2023-06-25 DIAGNOSIS — I63532 Cerebral infarction due to unspecified occlusion or stenosis of left posterior cerebral artery: Secondary | ICD-10-CM | POA: Diagnosis not present

## 2023-06-25 LAB — COMPREHENSIVE METABOLIC PANEL
ALT: 22 U/L (ref 0–44)
AST: 25 U/L (ref 15–41)
Albumin: 4.5 g/dL (ref 3.5–5.0)
Alkaline Phosphatase: 96 U/L (ref 38–126)
Anion gap: 12 (ref 5–15)
BUN: 10 mg/dL (ref 8–23)
CO2: 23 mmol/L (ref 22–32)
Calcium: 9.6 mg/dL (ref 8.9–10.3)
Chloride: 102 mmol/L (ref 98–111)
Creatinine, Ser: 0.88 mg/dL (ref 0.44–1.00)
GFR, Estimated: >60 mL/min (ref >60)
Glucose, Bld: 230 mg/dL — ABNORMAL HIGH (ref 70–99)
Potassium: 4.1 mmol/L (ref 3.5–5.1)
Sodium: 137 mmol/L (ref 135–145)
Total Bilirubin: 1.1 mg/dL (ref 0.3–1.2)
Total Protein: 8.1 g/dL (ref 6.5–8.1)

## 2023-06-25 LAB — DIFFERENTIAL
Abs Immature Granulocytes: 0.01 10*3/uL (ref 0.00–0.07)
Basophils Absolute: 0 10*3/uL (ref 0.0–0.1)
Basophils Relative: 1 %
Eosinophils Absolute: 0.1 10*3/uL (ref 0.0–0.5)
Eosinophils Relative: 2 %
Immature Granulocytes: 0 %
Lymphocytes Relative: 28 %
Lymphs Abs: 1.2 10*3/uL (ref 0.7–4.0)
Monocytes Absolute: 0.3 10*3/uL (ref 0.1–1.0)
Monocytes Relative: 6 %
Neutro Abs: 2.7 10*3/uL (ref 1.7–7.7)
Neutrophils Relative %: 63 %

## 2023-06-25 LAB — URINALYSIS, ROUTINE W REFLEX MICROSCOPIC
Bilirubin Urine: NEGATIVE
Glucose, UA: 50 mg/dL — AB
Hgb urine dipstick: NEGATIVE
Ketones, ur: NEGATIVE mg/dL
Leukocytes,Ua: NEGATIVE
Nitrite: NEGATIVE
Protein, ur: NEGATIVE mg/dL
Specific Gravity, Urine: 1.005 (ref 1.005–1.030)
pH: 5 (ref 5.0–8.0)

## 2023-06-25 LAB — RAPID URINE DRUG SCREEN, HOSP PERFORMED
Amphetamines: NOT DETECTED
Barbiturates: NOT DETECTED
Benzodiazepines: NOT DETECTED
Cocaine: NOT DETECTED
Opiates: NOT DETECTED
Tetrahydrocannabinol: NOT DETECTED

## 2023-06-25 LAB — CBC
HCT: 42.3 % (ref 36.0–46.0)
Hemoglobin: 14.4 g/dL (ref 12.0–15.0)
MCH: 29.9 pg (ref 26.0–34.0)
MCHC: 34 g/dL (ref 30.0–36.0)
MCV: 87.9 fL (ref 80.0–100.0)
Platelets: 201 10*3/uL (ref 150–400)
RBC: 4.81 MIL/uL (ref 3.87–5.11)
RDW: 12.8 % (ref 11.5–15.5)
WBC: 4.3 10*3/uL (ref 4.0–10.5)
nRBC: 0 % (ref 0.0–0.2)

## 2023-06-25 LAB — PROTIME-INR
INR: 1.1 (ref 0.8–1.2)
Prothrombin Time: 14.5 seconds (ref 11.4–15.2)

## 2023-06-25 LAB — CBG MONITORING, ED: Glucose-Capillary: 219 mg/dL — ABNORMAL HIGH (ref 70–99)

## 2023-06-25 LAB — APTT: aPTT: 31 seconds (ref 24–36)

## 2023-06-25 LAB — ETHANOL: Alcohol, Ethyl (B): <10 mg/dL (ref <10)

## 2023-06-25 MED ORDER — CLOPIDOGREL BISULFATE 75 MG PO TABS
300.0000 mg | ORAL_TABLET | Freq: Once | ORAL | Status: AC
  Administered 2023-06-25: 300 mg via ORAL
  Filled 2023-06-25: qty 4

## 2023-06-25 MED ORDER — LABETALOL HCL 5 MG/ML IV SOLN
10.0000 mg | Freq: Once | INTRAVENOUS | Status: AC
  Administered 2023-06-25: 10 mg via INTRAVENOUS
  Filled 2023-06-25: qty 4

## 2023-06-25 MED ORDER — ACETAMINOPHEN 325 MG PO TABS
650.0000 mg | ORAL_TABLET | ORAL | Status: DC | PRN
  Administered 2023-06-25 – 2023-06-26 (×3): 650 mg via ORAL
  Filled 2023-06-25 (×4): qty 2

## 2023-06-25 MED ORDER — SODIUM CHLORIDE 0.9 % IV SOLN: INTRAVENOUS | Status: AC

## 2023-06-25 MED ORDER — ACETAMINOPHEN 160 MG/5ML PO SOLN: 650.0000 mg | ORAL | Status: DC | PRN

## 2023-06-25 MED ORDER — ASPIRIN 325 MG PO TABS
650.0000 mg | ORAL_TABLET | Freq: Once | ORAL | Status: AC
  Administered 2023-06-25: 650 mg via ORAL
  Filled 2023-06-25: qty 2

## 2023-06-25 MED ORDER — SIMVASTATIN 20 MG PO TABS
40.0000 mg | ORAL_TABLET | Freq: Every day | ORAL | Status: DC
  Administered 2023-06-25: 40 mg via ORAL
  Filled 2023-06-25: qty 4

## 2023-06-25 MED ORDER — HYDRALAZINE HCL 20 MG/ML IJ SOLN
10.0000 mg | INTRAMUSCULAR | Status: DC | PRN
  Administered 2023-06-25 – 2023-06-26 (×3): 10 mg via INTRAVENOUS
  Filled 2023-06-25 (×4): qty 1

## 2023-06-25 MED ORDER — STROKE: EARLY STAGES OF RECOVERY BOOK
Freq: Once | Status: AC
  Filled 2023-06-25: qty 1

## 2023-06-25 MED ORDER — IOHEXOL 350 MG/ML SOLN
100.0000 mL | Freq: Once | INTRAVENOUS | Status: AC | PRN
  Administered 2023-06-25: 75 mL via INTRAVENOUS

## 2023-06-25 MED ORDER — ACETAMINOPHEN 650 MG RE SUPP: 650.0000 mg | RECTAL | Status: DC | PRN

## 2023-06-25 NOTE — ED Notes (Signed)
   06/25/23 1959  Hand-off documentation  Hand-off Received Received from shift RN/LPN  Report received from (Full Name) Chart  review and RN note from [facility]   RN contacted RN Forest Gleason, RN for report. RN review chart and notes and is ready to receive PT.

## 2023-06-25 NOTE — ED Notes (Signed)
Patient transported to MRI 

## 2023-06-25 NOTE — ED Triage Notes (Signed)
Pt c/o HTN, which she has a Hx of,  and "Vertigo since August 23rd and a week later  started having blurry vision in R eye" went to see PCP and was sent to the eye doctor. "seems like when I blink there is a delay in R eye blinking" states her son says she sounds like she slurrs some words. Also c/o L ear pressure. Equal grip strength bilaterally. A & O x4, able to move tongue back and forth, R side droop on mouth smile

## 2023-06-25 NOTE — ED Notes (Addendum)
ED TO INPATIENT HANDOFF REPORT  ED Nurse Name and Phone #: Toni Amend [phone]  S Name/Age/Gender Katrina Hodge 67 y.o. female Room/Bed: APA08/APA08  Code Status   Code Status: Full Code  Home/SNF/Other Home Patient oriented to: self, place, time, and situation Is this baseline? Yes   Triage Complete: Triage complete  Chief Complaint CVA (cerebral vascular accident) [facility]) [I63.9]  Triage Note Pt c/o HTN, which she has a Hx of,  and "Vertigo since August 23rd and a week later  started having blurry vision in R eye" went to see PCP and was sent to the eye doctor. "seems like when I blink there is a delay in R eye blinking" states her son says she sounds like she slurrs some words. Also c/o L ear pressure. Equal grip strength bilaterally. A & O x4, able to move tongue back and forth, R side droop on mouth smile    Allergies Allergies  Allergen Reactions   Levaquin [Levofloxacin In D5w]     rash    Level of Care/Admitting Diagnosis ED Disposition     ED Disposition  Admit   Condition  --   Comment  Hospital Area: [facility] [[id number]]  Level of Care: Telemetry [5]  Covid Evaluation: Asymptomatic - no recent exposure (last 10 days) testing not required  Diagnosis: CVA (cerebral vascular accident) [facility]) [[id number]]  Admitting Physician: Erick Blinks [[id number]]  Attending Physician: Erick Blinks [[id number]]          B Medical/Surgery History Past Medical History:  Diagnosis Date   Essential hypertension 10/08/2016   Past Surgical History:  Procedure Laterality Date   PARTIAL HYSTERECTOMY     fibroid tumors     A IV Location/Drains/Wounds Patient Lines/Drains/Airways Status     Active Line/Drains/Airways     Name Placement date Placement time Site Days   Peripheral IV 06/25/23 20 G 1" Anterior;Proximal;Right Forearm 06/25/23  1000  Forearm  less than 1            Intake/Output Last 24 hours No intake or output data in the 24 hours  ending 06/25/23 1928  Labs/Imaging Results for orders placed or performed during the hospital encounter of 06/25/23 (from the past 48 hour(s))  CBG monitoring, ED     Status: Abnormal   Collection Time: 06/25/23  9:52 AM  Result Value Ref Range   Glucose-Capillary 219 (H) 70 - 99 mg/dL    Comment: Glucose reference range applies only to samples taken after fasting for at least 8 hours.  Protime-INR     Status: None   Collection Time: 06/25/23  9:58 AM  Result Value Ref Range   Prothrombin Time 14.5 11.4 - 15.2 seconds   INR 1.1 0.8 - 1.2    Comment: (NOTE) INR goal varies based on device and disease states. Performed at [facility], [address] [place], [place] [address]   APTT     Status: None   Collection Time: 06/25/23  9:58 AM  Result Value Ref Range   aPTT 31 24 - 36 seconds    Comment: Performed at [facility], [address], [place], [place] [address]  CBC     Status: None   Collection Time: 06/25/23  9:58 AM  Result Value Ref Range   WBC 4.3 4.0 - 10.5 K/uL   RBC 4.81 3.87 - 5.11 MIL/uL   Hemoglobin 14.4 12.0 - 15.0 g/dL   HCT 32.4 40.1 - 02.7 %   MCV 87.9 80.0 -  100.0 fL   MCH 29.9 26.0 - 34.0 pg   MCHC 34.0 30.0 - 36.0 g/dL   RDW 40.9 81.1 - 91.4 %   Platelets 201 150 - 400 K/uL   nRBC 0.0 0.0 - 0.2 %    Comment: Performed at [facility], [address] [place], [place] [address]  Differential     Status: None   Collection Time: 06/25/23  9:58 AM  Result Value Ref Range   Neutrophils Relative % 63 %   Neutro Abs 2.7 1.7 - 7.7 K/uL   Lymphocytes Relative 28 %   Lymphs Abs 1.2 0.7 - 4.0 K/uL   Monocytes Relative 6 %   Monocytes Absolute 0.3 0.1 - 1.0 K/uL   Eosinophils Relative 2 %   Eosinophils Absolute 0.1 0.0 - 0.5 K/uL   Basophils Relative 1 %   Basophils Absolute 0.0 0.0 - 0.1 K/uL   Immature Granulocytes 0 %   Abs Immature Granulocytes 0.01 0.00 - 0.07 K/uL    Comment: Performed at [facility], [address] [place], [place] [address]   Comprehensive metabolic panel     Status: Abnormal   Collection Time: 06/25/23  9:58 AM  Result Value Ref Range   Sodium 137 135 - 145 mmol/L   Potassium 4.1 3.5 - 5.1 mmol/L   Chloride 102 98 - 111 mmol/L   CO2 23 22 - 32 mmol/L   Glucose, Bld 230 (H) 70 - 99 mg/dL    Comment: Glucose reference range applies only to samples taken after fasting for at least 8 hours.   BUN 10 8 - 23 mg/dL   Creatinine, Ser 8.65 0.44 - 1.00 mg/dL   Calcium 9.6 8.9 - 78.4 mg/dL   Total Protein 8.1 6.5 - 8.1 g/dL   Albumin 4.5 3.5 - 5.0 g/dL   AST 25 15 - 41 U/L   ALT 22 0 - 44 U/L   Alkaline Phosphatase 96 38 - 126 U/L   Total Bilirubin 1.1 0.3 - 1.2 mg/dL   GFR, Estimated >69 >62 mL/min    Comment: (NOTE) Calculated using the CKD-EPI Creatinine Equation (2021)    Anion gap 12 5 - 15    Comment: Performed at [facility], [address] [place], [place] [address]  Ethanol     Status: None   Collection Time: 06/25/23  9:58 AM  Result Value Ref Range   Alcohol, Ethyl (B) <10 <10 mg/dL    Comment: (NOTE) Lowest detectable limit for serum alcohol is 10 mg/dL.  For medical purposes only. Performed at [facility], [address] [place], [place] [address]   Urine rapid drug screen (hosp performed)     Status: None   Collection Time: 06/25/23  9:58 AM  Result Value Ref Range   Opiates NONE DETECTED NONE DETECTED   Cocaine NONE DETECTED NONE DETECTED   Benzodiazepines NONE DETECTED NONE DETECTED   Amphetamines NONE DETECTED NONE DETECTED   Tetrahydrocannabinol NONE DETECTED NONE DETECTED   Barbiturates NONE DETECTED NONE DETECTED    Comment: (NOTE) DRUG SCREEN FOR MEDICAL PURPOSES ONLY.  IF CONFIRMATION IS NEEDED FOR ANY PURPOSE, NOTIFY LAB WITHIN 5 DAYS.  LOWEST DETECTABLE LIMITS FOR URINE DRUG SCREEN Drug Class                     Cutoff (ng/mL) Amphetamine and metabolites    1000 Barbiturate and metabolites    200 Benzodiazepine  200 Opiates and metabolites         300 Cocaine and metabolites        300 THC                            50 Performed at [facility], [address] [place], [place] [address]   Urinalysis, Routine w reflex microscopic -Urine, Clean Catch     Status: Abnormal   Collection Time: 06/25/23  9:58 AM  Result Value Ref Range   Color, Urine STRAW (A) YELLOW   APPearance CLEAR CLEAR   Specific Gravity, Urine 1.005 1.005 - 1.030   pH 5.0 5.0 - 8.0   Glucose, UA 50 (A) NEGATIVE mg/dL   Hgb urine dipstick NEGATIVE NEGATIVE   Bilirubin Urine NEGATIVE NEGATIVE   Ketones, ur NEGATIVE NEGATIVE mg/dL   Protein, ur NEGATIVE NEGATIVE mg/dL   Nitrite NEGATIVE NEGATIVE   Leukocytes,Ua NEGATIVE NEGATIVE    Comment: Performed at [facility], [address], [place], [place] [address]   CT ANGIO HEAD NECK W WO CM  Result Date: 06/25/2023 CLINICAL DATA:  Vertigo, infarcts seen on MRI EXAM: CT ANGIOGRAPHY HEAD AND NECK WITH AND WITHOUT CONTRAST TECHNIQUE: Multidetector CT imaging of the head and neck was performed using the standard protocol during bolus administration of intravenous contrast. Multiplanar CT image reconstructions and MIPs were obtained to evaluate the vascular anatomy. Carotid stenosis measurements (when applicable) are obtained utilizing NASCET criteria, using the distal internal carotid diameter as the denominator. RADIATION DOSE REDUCTION: This exam was performed according to the departmental dose-optimization program which includes automated exposure control, adjustment of the mA and/or kV according to patient size and/or use of iterative reconstruction technique. CONTRAST:  75mL OMNIPAQUE IOHEXOL 350 MG/ML SOLN COMPARISON:  Same-day brain MRI/MRA FINDINGS: CT HEAD FINDINGS Brain: The known left pontine/middle cerebellar peduncle infarct is better seen on the same-day MRI. There is no associated hemorrhage or mass effect. There is no other evidence of acute infarct. There is no acute intracranial hemorrhage or extra-axial fluid  collection. Parenchymal volume is normal. The ventricles are normal in size. The pituitary and suprasellar region are normal. There is no mass lesion. There is no mass effect or midline shift. Vascular: See below. Skull: Normal. Negative for fracture or focal lesion. Sinuses/Orbits: The paranasal sinuses are clear. The globes and orbits are unremarkable. Other: The mastoid air cells and middle ear cavities are clear. Review of the MIP images confirms the above findings CTA NECK FINDINGS Aortic arch: The imaged aortic arch is normal. The origins of the major branch vessels are patent. The subclavian arteries are patent to the level imaged. Right carotid system: The right common, internal, and external carotid arteries are patent, without hemodynamically significant stenosis or occlusion. There is no evidence of dissection or aneurysm. Left carotid system: The left common, internal, and external carotid arteries are patent, without hemodynamically significant stenosis or occlusion. There is no evidence of dissection or aneurysm. Vertebral arteries: The vertebral arteries are patent, without hemodynamically significant stenosis or occlusion. There is no evidence of dissection or aneurysm. Skeleton: There is no acute osseous abnormality or suspicious osseous lesion. There is no visible canal hematoma. Other neck: The soft tissues of the neck are unremarkable. Upper chest: The imaged lung apices are clear. Review of the MIP images confirms the above findings CTA HEAD FINDINGS Anterior circulation: There is a mild atherosclerotic irregularity and narrowing of the intracranial ICAs. There is mild atherosclerotic  irregularity and narrowing of the M1 segments bilaterally. There is no proximal high-grade stenosis or occlusion. The bilateral ACAS are patent, without proximal high-grade stenosis or occlusion. The anterior communicating artery is not definitely seen. There is no aneurysm or AVM. Posterior circulation: The  bilateral V4 segments are patent. The basilar artery is patent with mild atherosclerotic irregularity and narrowing distally. The major cerebellar arteries including the left PICA and PICA appear patent. The bilateral PCAs are patent, with mild multifocal atherosclerotic irregularity and narrowing bilaterally but no proximal high-grade stenosis or occlusion. There is no aneurysm or AVM. Venous sinuses: Patent. Anatomic variants: None. Review of the MIP images confirms the above findings IMPRESSION: 1. The major cerebellar arteries including the left PICA and PICA appear patent. No acute vascular finding. 2. Mild intracranial atherosclerotic irregularity and narrowing of the anterior and posterior circulations without proximal high-grade stenosis or occlusion. No significant atherosclerotic disease in the neck. Electronically Signed   By: Lesia Hausen M.D.   On: 06/25/2023 17:11   MR BRAIN WO CONTRAST  Result Date: 06/25/2023 CLINICAL DATA:  Neuro deficit, acute, stroke suspected. EXAM: MRI HEAD WITHOUT CONTRAST MRA HEAD WITHOUT CONTRAST TECHNIQUE: Multiplanar, multi-echo pulse sequences of the brain and surrounding structures were acquired without intravenous contrast. Angiographic images of the Circle of Willis were acquired using MRA technique without intravenous contrast. COMPARISON:  None Available. FINDINGS: MRI HEAD FINDINGS Brain: Acute infarct in the left lateral pons and middle cerebellar peduncle (axial image 11 series 6). No acute hemorrhage or significant mass effect. Background of mild chronic small-vessel disease. No hydrocephalus or extra-axial collection. No mass effect or midline shift. Vascular: Normal flow voids. Skull and upper cervical spine: Normal marrow signal. Sinuses/Orbits: No acute or significant finding. Other: None. MRA HEAD FINDINGS Anterior circulation: Intracranial ICAs are patent without stenosis or aneurysm. The proximal ACAs and MCAs are patent without stenosis or aneurysm.  Distal branches are symmetric. Posterior circulation: Visualized portions of the distal vertebral arteries and basilar artery are patent without stenosis or aneurysm. The left AICA and PICA are not visualized. The PCAs are patent proximally without stenosis or aneurysm. Distal branches are symmetric. Anatomic variants: None. IMPRESSION: 1. Acute infarct in the left lateral pons and middle cerebellar peduncle. No acute hemorrhage or significant mass effect. 2. The left AICA and PICA are not visualized, which may be due to occlusion or slow flow. Consider CTA head and neck. Electronically Signed   By: Orvan Falconer M.D.   On: 06/25/2023 14:22   MR ANGIO HEAD WO CONTRAST  Result Date: 06/25/2023 CLINICAL DATA:  Neuro deficit, acute, stroke suspected. EXAM: MRI HEAD WITHOUT CONTRAST MRA HEAD WITHOUT CONTRAST TECHNIQUE: Multiplanar, multi-echo pulse sequences of the brain and surrounding structures were acquired without intravenous contrast. Angiographic images of the Circle of Willis were acquired using MRA technique without intravenous contrast. COMPARISON:  None Available. FINDINGS: MRI HEAD FINDINGS Brain: Acute infarct in the left lateral pons and middle cerebellar peduncle (axial image 11 series 6). No acute hemorrhage or significant mass effect. Background of mild chronic small-vessel disease. No hydrocephalus or extra-axial collection. No mass effect or midline shift. Vascular: Normal flow voids. Skull and upper cervical spine: Normal marrow signal. Sinuses/Orbits: No acute or significant finding. Other: None. MRA HEAD FINDINGS Anterior circulation: Intracranial ICAs are patent without stenosis or aneurysm. The proximal ACAs and MCAs are patent without stenosis or aneurysm. Distal branches are symmetric. Posterior circulation: Visualized portions of the distal vertebral arteries and basilar artery are patent without stenosis  or aneurysm. The left AICA and PICA are not visualized. The PCAs are patent  proximally without stenosis or aneurysm. Distal branches are symmetric. Anatomic variants: None. IMPRESSION: 1. Acute infarct in the left lateral pons and middle cerebellar peduncle. No acute hemorrhage or significant mass effect. 2. The left AICA and PICA are not visualized, which may be due to occlusion or slow flow. Consider CTA head and neck. Electronically Signed   By: Orvan Falconer M.D.   On: 06/25/2023 14:22    Pending Labs Unresulted Labs (From admission, onward)     Start     Ordered   06/26/23 0500  Lipid panel  (Labs)  Tomorrow morning,   R       Comments: Fasting    06/25/23 1627   06/25/23 1625  HIV Antibody (routine testing w rflx)  (HIV Antibody (Routine testing w reflex) panel)  Once,   R        06/25/23 1627   06/25/23 1625  Hemoglobin A1c  (Labs)  Once,   R       Comments: To assess prior glycemic control    06/25/23 1627            Vitals/Pain Today's Vitals   06/25/23 1815 06/25/23 1900 06/25/23 1915 06/25/23 1927  BP: (!) 218/90 (!) 197/93 (!) 209/93   Pulse: 86 79 78   Resp: 18 17 16    Temp:      TempSrc:      SpO2: 99% 100% 100%   PainSc:    0-No pain    Isolation Precautions No active isolations  Medications Medications   stroke: early stages of recovery book (has no administration in time range)  0.9 %  sodium chloride infusion ( Intravenous New Bag/Given 06/25/23 1659)  acetaminophen (TYLENOL) tablet 650 mg (650 mg Oral Given 06/25/23 1716)    Or  acetaminophen (TYLENOL) 160 MG/5ML solution 650 mg ( Per Tube See Alternative 06/25/23 1716)    Or  acetaminophen (TYLENOL) suppository 650 mg ( Rectal See Alternative 06/25/23 1716)  hydrALAZINE (APRESOLINE) injection 10 mg (10 mg Intravenous Given 06/25/23 1701)  simvastatin (ZOCOR) tablet 40 mg (40 mg Oral Given 06/25/23 1821)  labetalol (NORMODYNE) injection 10 mg (10 mg Intravenous Given 06/25/23 1038)  iohexol (OMNIPAQUE) 350 MG/ML injection 100 mL (75 mLs Intravenous Contrast Given 06/25/23 1531)   aspirin tablet 650 mg (650 mg Oral Given 06/25/23 1642)  clopidogrel (PLAVIX) tablet 300 mg (300 mg Oral Given 06/25/23 1642)    Mobility walks     Focused Assessments    R Recommendations: See Admitting Provider Note  Report given to:   Additional Notes: A&O; Ambu; Continent; 20G RFA with NS at 77mL/hr; Passed swallow screen; NIH Q2H(next at 2000); Permissive HTN(PRN ordered for SBP >220, DBP>110)

## 2023-06-25 NOTE — ED Notes (Signed)
Called into patient's room by family member reporting facial twitching. Upon assessment, patient is reporting L side facial twitching/spasm; L side facial droop. Denies sensory changes; no other neuro changes noted. Inpatient nurse notified

## 2023-06-25 NOTE — ED Notes (Signed)
Attempted to call report to 300; No bed assigned yet

## 2023-06-25 NOTE — H&P (Signed)
History and Physical    [name] JXB:[id number] DOB: 07/13/1956 DOA: 06/25/2023  PCP: Elfredia Nevins, MD   Patient coming from: Home  Chief Complaint: Vertigo/slurred speech/blurred vision  HPI: Katrina Hodge is a 67 y.o. female with medical history significant for hypertension presented to the ED from PCP office with elevated blood pressures, blurred vision, and some facial droop.  She has had vertigo symptoms over the past several weeks and for the past few days has had some intermittent aphasia as well as dysarthria.  She awoke this morning and noted that she had some right facial droop while brushing her teeth at 7:30 AM.   ED Course: Vital signs indicating elevated blood pressure levels and neurology was consulted by EDP who recommended CTA head and neck as well as brain MRI without contrast for further evaluation.  Brain MRI shows an acute infarct in the left lateral pons and middle cerebellar peduncle without hemorrhage or mass effect.  Recommendations are for further stroke evaluation per neurology.  Review of Systems: Reviewed as noted above, otherwise negative.  Past Medical History:  Diagnosis Date   Essential hypertension 10/08/2016    Past Surgical History:  Procedure Laterality Date   PARTIAL HYSTERECTOMY     fibroid tumors     reports that she has never smoked. She has never used smokeless tobacco. She reports that she does not drink alcohol and does not use drugs.  Allergies  Allergen Reactions   Levaquin [Levofloxacin In D5w]     rash    Family History  Problem Relation Age of Onset   Breast cancer Neg Hx     Prior to Admission medications   Medication Sig Start Date End Date Taking? Authorizing Provider  Cholecalciferol (VITAMIN D3) 25 MCG (1000 UT) CAPS Take 2,000 Units by mouth daily.   Yes [provider]  doxycycline (VIBRAMYCIN) 100 MG capsule Take 50 mg by mouth 2 (two) times daily. 06/12/23  Yes [provider]  fluticasone (FLONASE) 50 MCG/ACT nasal spray Place 1 spray into both nostrils daily. 06/10/23  Yes [provider]  ibuprofen (ADVIL,MOTRIN) 200 MG tablet Take 200 mg by mouth every 6 (six) hours as needed.   Yes [provider]  losartan (COZAAR) 100 MG tablet Take 100 mg by mouth daily. 06/03/23  Yes [provider]  Pseudoephedrine-Acetaminophen (DILOTAB PO) Take by mouth as needed.   Yes [provider]    Physical Exam: Vitals:   06/25/23 1315 06/25/23 1330 06/25/23 1345 06/25/23 1400  BP: (!) 233/106 (!) 221/104 (!) 216/100 (!) 217/104  Pulse: 71 74 71 68  Resp:      Temp:      TempSrc:      SpO2: 99% 99% 98% 99%    Constitutional: NAD, calm, comfortable Vitals:   06/25/23 1315 06/25/23 1330 06/25/23 1345 06/25/23 1400  BP: (!) 233/106 (!) 221/104 (!) 216/100 (!) 217/104  Pulse: 71 74 71 68  Resp:      Temp:      TempSrc:      SpO2: 99% 99% 98% 99%   Eyes: lids and conjunctivae normal Neck: normal, supple Respiratory: clear to auscultation bilaterally. Normal respiratory effort. No accessory muscle use.  Cardiovascular: Regular rate and rhythm, no murmurs. Abdomen: no tenderness, no distention. Bowel sounds positive.  Musculoskeletal:  No edema. Skin: no rashes, lesions, ulcers.  Psychiatric: Flat affect  Labs on Admission: I have personally reviewed following labs and imaging studies  CBC: Recent Labs  Lab 06/25/23 0958  WBC 4.3  NEUTROABS 2.7  HGB 14.4  HCT 42.3  MCV 87.9  PLT 201   Basic Metabolic Panel: Recent Labs  Lab 06/25/23 0958  NA 137  K 4.1  CL 102  CO2 23  GLUCOSE 230*  BUN 10  CREATININE 0.88  CALCIUM 9.6   GFR: CrCl cannot be calculated (Unknown ideal weight.). Liver Function Tests: Recent Labs  Lab 06/25/23 0958  AST 25  ALT 22  ALKPHOS 96  BILITOT 1.1  PROT 8.1  ALBUMIN 4.5   No results for input(s): "LIPASE", "AMYLASE" in the last 168 hours. No results for input(s): "AMMONIA" in  the last 168 hours. Coagulation Profile: Recent Labs  Lab 06/25/23 0958  INR 1.1   Cardiac Enzymes: No results for input(s): "CKTOTAL", "CKMB", "CKMBINDEX", "TROPONINI" in the last 168 hours. BNP (last 3 results) No results for input(s): "PROBNP" in the last 8760 hours. HbA1C: No results for input(s): "HGBA1C" in the last 72 hours. CBG: Recent Labs  Lab 06/25/23 0952  GLUCAP 219*   Lipid Profile: No results for input(s): "CHOL", "HDL", "LDLCALC", "TRIG", "CHOLHDL", "LDLDIRECT" in the last 72 hours. Thyroid Function Tests: No results for input(s): "TSH", "T4TOTAL", "FREET4", "T3FREE", "THYROIDAB" in the last 72 hours. Anemia Panel: No results for input(s): "VITAMINB12", "FOLATE", "FERRITIN", "TIBC", "IRON", "RETICCTPCT" in the last 72 hours. Urine analysis:    Component Value Date/Time   COLORURINE STRAW (A) 06/25/2023 0958   APPEARANCEUR CLEAR 06/25/2023 0958   LABSPEC 1.005 06/25/2023 0958   PHURINE 5.0 06/25/2023 0958   GLUCOSEU 50 (A) 06/25/2023 0958   HGBUR NEGATIVE 06/25/2023 0958   BILIRUBINUR NEGATIVE 06/25/2023 0958   KETONESUR NEGATIVE 06/25/2023 0958   PROTEINUR NEGATIVE 06/25/2023 0958   NITRITE NEGATIVE 06/25/2023 0958   LEUKOCYTESUR NEGATIVE 06/25/2023 0958    Radiological Exams on Admission: MR BRAIN WO CONTRAST  Result Date: 06/25/2023 CLINICAL DATA:  Neuro deficit, acute, stroke suspected. EXAM: MRI HEAD WITHOUT CONTRAST MRA HEAD WITHOUT CONTRAST TECHNIQUE: Multiplanar, multi-echo pulse sequences of the brain and surrounding structures were acquired without intravenous contrast. Angiographic images of the Circle of Willis were acquired using MRA technique without intravenous contrast. COMPARISON:  None Available. FINDINGS: MRI HEAD FINDINGS Brain: Acute infarct in the left lateral pons and middle cerebellar peduncle (axial image 11 series 6). No acute hemorrhage or significant mass effect. Background of mild chronic small-vessel disease. No hydrocephalus or  extra-axial collection. No mass effect or midline shift. Vascular: Normal flow voids. Skull and upper cervical spine: Normal marrow signal. Sinuses/Orbits: No acute or significant finding. Other: None. MRA HEAD FINDINGS Anterior circulation: Intracranial ICAs are patent without stenosis or aneurysm. The proximal ACAs and MCAs are patent without stenosis or aneurysm. Distal branches are symmetric. Posterior circulation: Visualized portions of the distal vertebral arteries and basilar artery are patent without stenosis or aneurysm. The left AICA and PICA are not visualized. The PCAs are patent proximally without stenosis or aneurysm. Distal branches are symmetric. Anatomic variants: None. IMPRESSION: 1. Acute infarct in the left lateral pons and middle cerebellar peduncle. No acute hemorrhage or significant mass effect. 2. The left AICA and PICA are not visualized, which may be due to occlusion or slow flow. Consider CTA head and neck. Electronically Signed   By: Orvan Falconer M.D.   On: 06/25/2023 14:22   MR ANGIO HEAD WO CONTRAST  Result Date: 06/25/2023 CLINICAL DATA:  Neuro deficit, acute, stroke suspected. EXAM: MRI HEAD WITHOUT CONTRAST MRA HEAD WITHOUT CONTRAST TECHNIQUE: Multiplanar, multi-echo  pulse sequences of the brain and surrounding structures were acquired without intravenous contrast. Angiographic images of the Circle of Willis were acquired using MRA technique without intravenous contrast. COMPARISON:  None Available. FINDINGS: MRI HEAD FINDINGS Brain: Acute infarct in the left lateral pons and middle cerebellar peduncle (axial image 11 series 6). No acute hemorrhage or significant mass effect. Background of mild chronic small-vessel disease. No hydrocephalus or extra-axial collection. No mass effect or midline shift. Vascular: Normal flow voids. Skull and upper cervical spine: Normal marrow signal. Sinuses/Orbits: No acute or significant finding. Other: None. MRA HEAD FINDINGS Anterior  circulation: Intracranial ICAs are patent without stenosis or aneurysm. The proximal ACAs and MCAs are patent without stenosis or aneurysm. Distal branches are symmetric. Posterior circulation: Visualized portions of the distal vertebral arteries and basilar artery are patent without stenosis or aneurysm. The left AICA and PICA are not visualized. The PCAs are patent proximally without stenosis or aneurysm. Distal branches are symmetric. Anatomic variants: None. IMPRESSION: 1. Acute infarct in the left lateral pons and middle cerebellar peduncle. No acute hemorrhage or significant mass effect. 2. The left AICA and PICA are not visualized, which may be due to occlusion or slow flow. Consider CTA head and neck. Electronically Signed   By: Orvan Falconer M.D.   On: 06/25/2023 14:22    EKG: Independently reviewed. SR 89bpm.  Assessment/Plan Principal Problem:   CVA (cerebral vascular accident) (HCC) Active Problems:   Essential hypertension   Acute left lateral pons CVA -CT angio head and neck pending -A1c and lipid panel -2D echocardiogram -Telemetry monitoring -Plan to load with aspirin 650 and Plavix 300 today followed by aspirin and Plavix tomorrow -High intensity statin -PT/OT/SLP evaluation  Elevated hypertension -Likely secondary to CVA above and will order hydralazine as needed for elevations of 220/110 and above -Hold home blood pressure agents  DVT prophylaxis: SCDs Code Status: Full Family Communication: Husband at bedside  Disposition Plan: Admit for CVA evaluation Consults called: Neurology Admission status: Observation, telemetry  Severity of Illness: The appropriate patient status for this patient is OBSERVATION. Observation status is judged to be reasonable and necessary in order to provide the required intensity of service to ensure the patient's safety. The patient's presenting symptoms, physical exam findings, and initial radiographic and laboratory data in the context  of their medical condition is felt to place them at decreased risk for further clinical deterioration. Furthermore, it is anticipated that the patient will be medically stable for discharge from the hospital within 2 midnights of admission.    Katrina Hodge D Sherryll Burger DO [facility]  If 7PM-7AM, please contact night-coverage [email or web address]  06/25/2023, 4:03 PM

## 2023-06-25 NOTE — ED Notes (Signed)
DO and PA-C at bedside

## 2023-06-25 NOTE — Plan of Care (Addendum)
On-call neurology note-10:15 AM  Received call from Dr. Maple Hudson EDP at [facility]. Patient with multiple days of vertiginous symptoms off and on and blurred vision that has been going on for a while now presents with slurred speech since Sunday and possible right facial droop as well.  Blood pressures are extremely hypertensive.  Due to the symptoms at least of slurred speech and possible facial involvement now multiple days in, not a code stroke.  No aphasia according to the EDP-only some slurred speech noticed by family but on his exam, speech is clear.  Small stroke versus hypertensive urgency versus posterior reversible encephalopathy syndrome in the differentials based on brief chart.  Recommend CTA head and neck to rule out any vascular abnormality as well as MRI brain without contrast to rule out changes related to hypertension and PR ES or small stroke.   Addendum: 2:51 PM MRI of the brain and MRA of the head completed.  Personally reviewed.  Acute infarct in the left lateral pons and middle cerebellar peduncle without any hemorrhage or mass effect.  The left AICA and PICA are not well-visualized.  May be due to occlusion or slow flow.  Agree with the radiologist read.   Impression: Acute ischemic stroke  Recommendations for the admitting hospitalist: Admit to hospitalist Frequent neurochecks Telemetry 2D echo CT angiography head and neck A1c, lipid panel Does not look like she is on any antiplatelets at home.  I would do a load of aspirin 650 and Plavix 300 today followed by aspirin 81 and Plavix 75 from tomorrow. High intensity statin for goal LDL less than 70 PT OT speech therapy Please have Dr. Melynda Ripple see her in inpatient consultation tomorrow.  -- Milon Dikes, MD Neurologist [facility] Pager: [phone]

## 2023-06-25 NOTE — ED Provider Notes (Signed)
[place] EMERGENCY DEPARTMENT AT [facility] Provider Note   CSN: [id number] Arrival date & time: 06/25/23  4098     History  Chief Complaint  Patient presents with   Hypertension    Katrina Hodge is a 67 y.o. female.  67 year old female presenting emergency department from PCP office with hypertension, blurred vision, facial droop.  Patient has had vertigo for the past several weeks, for the past several days has had some dysarthria and intermittent aphasia.  Awoke this morning and noted that she had some right facial droop while brushing teeth at 730.   Hypertension       Home Medications Prior to Admission medications   Medication Sig Start Date End Date Taking? Authorizing Provider  Cholecalciferol (VITAMIN D3) 25 MCG (1000 UT) CAPS Take 2,000 Units by mouth daily.   Yes [provider]  doxycycline (VIBRAMYCIN) 100 MG capsule Take 50 mg by mouth 2 (two) times daily. 06/12/23  Yes [provider]  fluticasone (FLONASE) 50 MCG/ACT nasal spray Place 1 spray into both nostrils daily. 06/10/23  Yes [provider]  ibuprofen (ADVIL,MOTRIN) 200 MG tablet Take 200 mg by mouth every 6 (six) hours as needed.   Yes [provider]  losartan (COZAAR) 100 MG tablet Take 100 mg by mouth daily. 06/03/23  Yes [provider]  Pseudoephedrine-Acetaminophen (DILOTAB PO) Take by mouth as needed.   Yes [provider]      Allergies    Levaquin [levofloxacin in d5w]    Review of Systems   Review of Systems  Physical Exam Updated Vital Signs BP (!) 217/104   Pulse 68   Temp 98.3 F (36.8 C) (Oral)   Resp 16   SpO2 99%  Physical Exam Vitals and nursing note reviewed.  Constitutional:      General: She is not in acute distress.    Appearance: She is not toxic-appearing.  HENT:     Head: Normocephalic.     Nose: Nose normal.     Mouth/Throat:     Mouth: Mucous membranes are moist.  Eyes:      Conjunctiva/sclera: Conjunctivae normal.  Cardiovascular:     Rate and Rhythm: Normal rate.  Pulmonary:     Effort: Pulmonary effort is normal.     Breath sounds: Normal breath sounds.  Abdominal:     General: Abdomen is flat. There is no distension.     Tenderness: There is no abdominal tenderness. There is no guarding or rebound.  Musculoskeletal:        General: Normal range of motion.     Cervical back: Normal range of motion.  Skin:    General: Skin is warm and dry.     Capillary Refill: Capillary refill takes less than 2 seconds.  Neurological:     Mental Status: She is alert.     Comments: Patient has right lower facial droop.  Otherwise cranial nerves intact.  Coordinated movements.  Good strength in upper and lower extremities.  Normal sensation in all extremities.  Normal heel-to-shin  Psychiatric:        Mood and Affect: Mood normal.        Behavior: Behavior normal.     ED Results / Procedures / Treatments   Labs (all labs ordered are listed, but only abnormal results are displayed) Labs Reviewed  COMPREHENSIVE METABOLIC PANEL - Abnormal; Notable for the following components:      Result Value   Glucose, Bld 230 (*)  All other components within normal limits  URINALYSIS, ROUTINE W REFLEX MICROSCOPIC - Abnormal; Notable for the following components:   Color, Urine STRAW (*)    Glucose, UA 50 (*)    All other components within normal limits  CBG MONITORING, ED - Abnormal; Notable for the following components:   Glucose-Capillary 219 (*)    All other components within normal limits  PROTIME-INR  APTT  CBC  DIFFERENTIAL  ETHANOL  RAPID URINE DRUG SCREEN, HOSP PERFORMED    EKG EKG Interpretation Date/Time:  Thursday June 25 2023 09:50:07 EDT Ventricular Rate:  89 PR Interval:  179 QRS Duration:  109 QT Interval:  376 QTC Calculation: 458 R Axis:   -18  Text Interpretation: Sinus rhythm Biatrial enlargement Left ventricular hypertrophy Confirmed  by Estanislado Pandy [phone]) on 06/25/2023 1:39:35 PM  Radiology MR BRAIN WO CONTRAST  Result Date: 06/25/2023 CLINICAL DATA:  Neuro deficit, acute, stroke suspected. EXAM: MRI HEAD WITHOUT CONTRAST MRA HEAD WITHOUT CONTRAST TECHNIQUE: Multiplanar, multi-echo pulse sequences of the brain and surrounding structures were acquired without intravenous contrast. Angiographic images of the Circle of Willis were acquired using MRA technique without intravenous contrast. COMPARISON:  None Available. FINDINGS: MRI HEAD FINDINGS Brain: Acute infarct in the left lateral pons and middle cerebellar peduncle (axial image 11 series 6). No acute hemorrhage or significant mass effect. Background of mild chronic small-vessel disease. No hydrocephalus or extra-axial collection. No mass effect or midline shift. Vascular: Normal flow voids. Skull and upper cervical spine: Normal marrow signal. Sinuses/Orbits: No acute or significant finding. Other: None. MRA HEAD FINDINGS Anterior circulation: Intracranial ICAs are patent without stenosis or aneurysm. The proximal ACAs and MCAs are patent without stenosis or aneurysm. Distal branches are symmetric. Posterior circulation: Visualized portions of the distal vertebral arteries and basilar artery are patent without stenosis or aneurysm. The left AICA and PICA are not visualized. The PCAs are patent proximally without stenosis or aneurysm. Distal branches are symmetric. Anatomic variants: None. IMPRESSION: 1. Acute infarct in the left lateral pons and middle cerebellar peduncle. No acute hemorrhage or significant mass effect. 2. The left AICA and PICA are not visualized, which may be due to occlusion or slow flow. Consider CTA head and neck. Electronically Signed   By: Orvan Falconer M.D.   On: 06/25/2023 14:22   MR ANGIO HEAD WO CONTRAST  Result Date: 06/25/2023 CLINICAL DATA:  Neuro deficit, acute, stroke suspected. EXAM: MRI HEAD WITHOUT CONTRAST MRA HEAD WITHOUT CONTRAST TECHNIQUE:  Multiplanar, multi-echo pulse sequences of the brain and surrounding structures were acquired without intravenous contrast. Angiographic images of the Circle of Willis were acquired using MRA technique without intravenous contrast. COMPARISON:  None Available. FINDINGS: MRI HEAD FINDINGS Brain: Acute infarct in the left lateral pons and middle cerebellar peduncle (axial image 11 series 6). No acute hemorrhage or significant mass effect. Background of mild chronic small-vessel disease. No hydrocephalus or extra-axial collection. No mass effect or midline shift. Vascular: Normal flow voids. Skull and upper cervical spine: Normal marrow signal. Sinuses/Orbits: No acute or significant finding. Other: None. MRA HEAD FINDINGS Anterior circulation: Intracranial ICAs are patent without stenosis or aneurysm. The proximal ACAs and MCAs are patent without stenosis or aneurysm. Distal branches are symmetric. Posterior circulation: Visualized portions of the distal vertebral arteries and basilar artery are patent without stenosis or aneurysm. The left AICA and PICA are not visualized. The PCAs are patent proximally without stenosis or aneurysm. Distal branches are symmetric. Anatomic variants: None. IMPRESSION: 1. Acute infarct in the  left lateral pons and middle cerebellar peduncle. No acute hemorrhage or significant mass effect. 2. The left AICA and PICA are not visualized, which may be due to occlusion or slow flow. Consider CTA head and neck. Electronically Signed   By: Orvan Falconer M.D.   On: 06/25/2023 14:22    Procedures Procedures    Medications Ordered in ED Medications  iohexol (OMNIPAQUE) 350 MG/ML injection 100 mL (has no administration in time range)  labetalol (NORMODYNE) injection 10 mg (10 mg Intravenous Given 06/25/23 1038)    ED Course/ Medical Decision Making/ A&P Clinical Course as of 06/25/23 1529  Thu Jun 25, 2023  1015 Case discussed with neurology, does not feel that patient should be  .upgraded to code stroke given symptoms ongoing for the past several days.  Recommending MRI brain and CTA/MRA to rule out stroke vs dissection versus PRESS versus hypertensive urgency.  Recommending keeping blood pressure below 220. [TY]  1451 Case were discussed with neurology recommending admission to hospital service can stay at [facility];  He will make an addendum to his note for further recommendations. [TY]    Clinical Course User Index [TY] Coral Spikes, DO                                 Medical Decision Making 67 year old female present emergency department with elevated blood pressure with with facial droop.  Symptoms been ongoing for the past several days possibly weeks giving her vertigo symptoms.  She is hypertensive blood pressure 225/128.  Given IV labetalol.  Case discussed with neurology.  See ED course.  MRI with acute stroke.  Lab workup largely reassuring no metabolic derangements.  No leukocytosis.  Coags normal.  Drug screen negative.  UA negative for UTI.  Given findings will admit for acute stroke.  Amount and/or Complexity of Data Reviewed Independent Historian:     Details: Spouse noted slurred speech yesterday External Data Reviewed:     Details: Per chart review has history of hypertension Labs: ordered. Radiology: ordered.  Risk Prescription drug management. Decision regarding hospitalization.          Final Clinical Impression(s) / ED Diagnoses Final diagnoses:  None    Rx / DC Orders ED Discharge Orders     None         Coral Spikes, DO 06/25/23 1529

## 2023-06-26 ENCOUNTER — Telehealth: Payer: Self-pay | Admitting: *Deleted

## 2023-06-26 ENCOUNTER — Observation Stay (HOSPITAL_BASED_OUTPATIENT_CLINIC_OR_DEPARTMENT_OTHER): Payer: No Typology Code available for payment source

## 2023-06-26 DIAGNOSIS — I639 Cerebral infarction, unspecified: Secondary | ICD-10-CM | POA: Diagnosis not present

## 2023-06-26 DIAGNOSIS — I6389 Other cerebral infarction: Secondary | ICD-10-CM | POA: Diagnosis not present

## 2023-06-26 LAB — ECHOCARDIOGRAM COMPLETE
AR max vel: 1.87 cm2
AV Peak grad: 10.9 mmHg
Ao pk vel: 1.65 m/s
Area-P 1/2: 3.93 cm2
S' Lateral: 2.8 cm

## 2023-06-26 LAB — GLUCOSE, CAPILLARY
Glucose-Capillary: 216 mg/dL — ABNORMAL HIGH (ref 70–99)
Glucose-Capillary: 217 mg/dL — ABNORMAL HIGH (ref 70–99)

## 2023-06-26 LAB — LIPID PANEL
Cholesterol: 241 mg/dL — ABNORMAL HIGH (ref 0–200)
HDL: 74 mg/dL (ref >40)
LDL Cholesterol: 146 mg/dL — ABNORMAL HIGH (ref 0–99)
Total CHOL/HDL Ratio: 3.3 RATIO
Triglycerides: 104 mg/dL (ref <150)
VLDL: 21 mg/dL (ref 0–40)

## 2023-06-26 LAB — HEMOGLOBIN A1C
Hgb A1c MFr Bld: 13.3 % — ABNORMAL HIGH (ref 4.8–5.6)
Mean Plasma Glucose: 335 mg/dL

## 2023-06-26 LAB — HIV ANTIBODY (ROUTINE TESTING W REFLEX): HIV Screen 4th Generation wRfx: NONREACTIVE

## 2023-06-26 MED ORDER — LOSARTAN POTASSIUM 50 MG PO TABS: 100.0000 mg | ORAL_TABLET | Freq: Once | ORAL | Status: DC

## 2023-06-26 MED ORDER — CLOPIDOGREL BISULFATE 75 MG PO TABS
75.0000 mg | ORAL_TABLET | Freq: Every day | ORAL | Status: DC
  Administered 2023-06-26 – 2023-06-27 (×2): 75 mg via ORAL
  Filled 2023-06-26 (×2): qty 1

## 2023-06-26 MED ORDER — ASPIRIN 81 MG PO CHEW
81.0000 mg | CHEWABLE_TABLET | Freq: Every day | ORAL | Status: DC
  Administered 2023-06-26 – 2023-06-27 (×2): 81 mg via ORAL
  Filled 2023-06-26 (×2): qty 1

## 2023-06-26 MED ORDER — ONDANSETRON HCL 4 MG/2ML IJ SOLN
4.0000 mg | Freq: Four times a day (QID) | INTRAMUSCULAR | Status: DC | PRN
  Administered 2023-06-26: 4 mg via INTRAVENOUS

## 2023-06-26 MED ORDER — INSULIN ASPART 100 UNIT/ML IJ SOLN
0.0000 [IU] | Freq: Three times a day (TID) | INTRAMUSCULAR | Status: DC
  Administered 2023-06-26 – 2023-06-27 (×4): 5 [IU] via SUBCUTANEOUS

## 2023-06-26 MED ORDER — INSULIN STARTER KIT- PEN NEEDLES (ENGLISH)
1.0000 | Freq: Once | Status: DC
  Filled 2023-06-26: qty 1

## 2023-06-26 MED ORDER — CLOPIDOGREL BISULFATE 75 MG PO TABS: 75.0000 mg | ORAL_TABLET | Freq: Every day | ORAL | Status: DC

## 2023-06-26 MED ORDER — ATORVASTATIN CALCIUM 40 MG PO TABS
40.0000 mg | ORAL_TABLET | Freq: Every day | ORAL | Status: DC
  Administered 2023-06-26 – 2023-06-27 (×2): 40 mg via ORAL
  Filled 2023-06-26 (×2): qty 1

## 2023-06-26 MED ORDER — INSULIN ASPART 100 UNIT/ML IJ SOLN
0.0000 [IU] | Freq: Every day | INTRAMUSCULAR | Status: DC
  Administered 2023-06-26: 2 [IU] via SUBCUTANEOUS

## 2023-06-26 MED ORDER — LIVING WELL WITH DIABETES BOOK: Freq: Once | Status: AC

## 2023-06-26 MED ORDER — HYDRALAZINE HCL 20 MG/ML IJ SOLN
10.0000 mg | INTRAMUSCULAR | Status: DC | PRN
  Administered 2023-06-27 (×2): 10 mg via INTRAVENOUS
  Filled 2023-06-26 (×2): qty 1

## 2023-06-26 MED ORDER — LOSARTAN POTASSIUM 50 MG PO TABS
100.0000 mg | ORAL_TABLET | Freq: Every day | ORAL | Status: DC
  Administered 2023-06-26 – 2023-06-27 (×3): 100 mg via ORAL
  Filled 2023-06-26 (×3): qty 2

## 2023-06-26 NOTE — Inpatient Diabetes Management (Signed)
Inpatient Diabetes Program Recommendations  AACE/ADA: New Consensus Statement on Inpatient Glycemic Control (2015)  Target Ranges:  Prepandial:   less than 140 mg/dL      Peak postprandial:   less than 180 mg/dL (1-2 hours)      Critically ill patients:  140 - 180 mg/dL   Lab Results  Component Value Date   GLUCAP 219 (H) 06/25/2023   HGBA1C 13.3 (H) 06/25/2023    Diabetes history: New Onset DM2 Current orders for Inpatient glycemic control: Novolog 0-15 units tid, 0-5 units hs correction  Inpatient Diabetes Program Recommendations:    Noted patient has new onset DM2 Spoke with pt about new diagnosis via phone (DM coordinator working remotely). Discussed A1C results with them and explained what an A1C is, basic pathophysiology of DM Type 2, basic home care, basic diabetes diet nutrition principles, importance of checking CBGs and maintaining good CBG control to prevent long-term and short-term complications. Reviewed signs and symptoms of hyperglycemia and hypoglycemia and how to treat hypoglycemia at home. Also reviewed blood sugar goals at home.  RNs to provide ongoing basic DM education at bedside with this patient. Have ordered educational booklet and insulin pen starter kit. Patient shared she has had vertigo since August 23rd. Patient was started on steroids and zpack. Patient took steroids x 2 weeks and she has lost 20 lbs.  Thank you, Billy Fischer. Suttyn Cryder, RN, MSN, CDE  Diabetes Coordinator Inpatient Glycemic Control Team Team Pager [phone] (8am-5pm) 06/26/2023 8:46 PM

## 2023-06-26 NOTE — Progress Notes (Signed)
   06/26/23 1730  Assess: MEWS Score  Temp 98.5 F (36.9 C)  BP (!) 212/91  Level of Consciousness Alert  SpO2 100 %  O2 Device Room Air  Assess: MEWS Score  MEWS Temp 0  MEWS Systolic 2  MEWS Pulse 2  MEWS RR 0  MEWS LOC 0  MEWS Score 4  MEWS Score Color Red  Assess: if the MEWS score is Yellow or Red  Were vital signs accurate and taken at a resting state? Yes  Does the patient meet 2 or more of the SIRS criteria? No  MEWS guidelines implemented  No, previously red, continue vital signs every 4 hours  Notify: Charge Nurse/RN  Name of Charge Nurse/RN Notified Krisit  Provider Notification  Provider Name/Title Dr Sherryll Burger  Date Provider Notified 06/26/23  Time Provider Notified 1730  Method of Notification Page  Provider response See new orders  Date of Provider Response 06/26/23  Time of Provider Response 1730  Assess: SIRS CRITERIA  SIRS Temperature  0  SIRS Pulse 1  SIRS Respirations  0  SIRS WBC 0  SIRS Score Sum  1

## 2023-06-26 NOTE — Progress Notes (Signed)
Blood pressure has been elevated above 220 today with apresoline 10 given.  Dr. Sherryll Burger ordered losartan 100 mg patient and vomited dose along with other oral medication. Recheck later of bp and was still elevated so another dose of losartan given instead of apresoline per Dr. Sherryll Burger and zofran given IV.   Dr. Sherryll Burger lowered parameter for blood pressure to 180 sys. CVA deficits are still assymetrical smile and blurred vision in left eye.  No deficits in  limbs Family has been at bedside all day.

## 2023-06-26 NOTE — Plan of Care (Signed)
  Problem: Education: Goal: Knowledge of disease or condition will improve Outcome: Progressing Goal: Knowledge of secondary prevention will improve (MUST DOCUMENT ALL) Outcome: Progressing Goal: Knowledge of patient specific risk factors will improve Loraine Leriche N/A or DELETE if not current risk factor) Outcome: Progressing   Problem: Ischemic Stroke/TIA Tissue Perfusion: Goal: Complications of ischemic stroke/TIA will be minimized Outcome: Progressing   Problem: Coping: Goal: Will verbalize positive feelings about self Outcome: Progressing Goal: Will identify appropriate support needs Outcome: Progressing   Problem: Health Behavior/Discharge Planning: Goal: Ability to manage health-related needs will improve Outcome: Progressing Goal: Goals will be collaboratively established with patient/family Outcome: Progressing   Problem: Self-Care: Goal: Ability to participate in self-care as condition permits will improve Outcome: Progressing Goal: Verbalization of feelings and concerns over difficulty with self-care will improve Outcome: Progressing Goal: Ability to communicate needs accurately will improve Outcome: Progressing   Problem: Nutrition: Goal: Risk of aspiration will decrease Outcome: Progressing Goal: Dietary intake will improve Outcome: Progressing   Problem: Education: Goal: Knowledge of General Education information will improve Description: Including pain rating scale, medication(s)/side effects and non-pharmacologic comfort measures Outcome: Progressing   Problem: Health Behavior/Discharge Planning: Goal: Ability to manage health-related needs will improve Outcome: Progressing   Problem: Clinical Measurements: Goal: Ability to maintain clinical measurements within normal limits will improve Outcome: Progressing Goal: Will remain free from infection Outcome: Progressing Goal: Diagnostic test results will improve Outcome: Progressing Goal: Respiratory  complications will improve Outcome: Progressing Goal: Cardiovascular complication will be avoided Outcome: Progressing   Problem: Activity: Goal: Risk for activity intolerance will decrease Outcome: Progressing   Problem: Nutrition: Goal: Adequate nutrition will be maintained Outcome: Progressing   Problem: Coping: Goal: Level of anxiety will decrease Outcome: Progressing   Problem: Elimination: Goal: Will not experience complications related to bowel motility Outcome: Progressing Goal: Will not experience complications related to urinary retention Outcome: Progressing   Problem: Pain Managment: Goal: General experience of comfort will improve Outcome: Progressing   Problem: Safety: Goal: Ability to remain free from injury will improve Outcome: Progressing   Problem: Skin Integrity: Goal: Risk for impaired skin integrity will decrease Outcome: Progressing

## 2023-06-26 NOTE — Progress Notes (Signed)
Echocardiogram 2D Echocardiogram has been performed.  Katrina Hodge 06/26/2023, 11:26 AM

## 2023-06-26 NOTE — Progress Notes (Signed)
   06/26/23 1530  Assess: MEWS Score  Temp 98.3 F (36.8 C)  BP (!) 240/101  Pulse Rate (!) 113  Resp 16  Level of Consciousness Alert  SpO2 100 %  O2 Device Room Air  Assess: MEWS Score  MEWS Temp 0  MEWS Systolic 2  MEWS Pulse 2  MEWS RR 0  MEWS LOC 0  MEWS Score 4  MEWS Score Color Red  Assess: if the MEWS score is Yellow or Red  Were vital signs accurate and taken at a resting state? Yes  Does the patient meet 2 or more of the SIRS criteria? No  MEWS guidelines implemented  Yes, red  Treat  MEWS Interventions Considered administering scheduled or prn medications/treatments as ordered  Take Vital Signs  Increase Vital Sign Frequency  Red: Q1hr x2, continue Q4hrs until patient remains green for 12hrs  Escalate  MEWS: Escalate Red: Discuss with charge nurse and notify provider. Consider notifying RRT. If remains red for 2 hours consider need for higher level of care  Provider Notification  Provider Name/Title Dr. Sherryll Burger  Date Provider Notified 06/26/23  Time Provider Notified 1530  Method of Notification Face-to-face  Notification Reason Critical Result  Provider response See new orders (started losartan)  Date of Provider Response 06/26/23  Time of Provider Response 1530  Assess: SIRS CRITERIA  SIRS Temperature  0  SIRS Pulse 1  SIRS Respirations  0  SIRS WBC 0  SIRS Score Sum  1

## 2023-06-26 NOTE — Progress Notes (Signed)
Pt lives with husband and is independent with ADLs. She is working full-time. No needs reported at this time. TOC will follow.    06/26/23 0923  TOC Brief Assessment  Insurance and Status Reviewed  Patient has primary care physician Yes  Home environment has been reviewed Lives with husband.  Prior level of function: Independent.  Prior/Current Home Services No current home services  Social Determinants of Health Reivew SDOH reviewed no interventions necessary  Readmission risk has been reviewed Yes  Transition of care needs no transition of care needs at this time

## 2023-06-26 NOTE — Evaluation (Signed)
Occupational Therapy Evaluation Patient Details Name: Katrina Hodge MRN: [id number] DOB: November 04, 1955 Today's Date: 06/26/2023   History of Present Illness Katrina Hodge is a 67 y.o. female with medical history significant for hypertension presented to the ED from PCP office with elevated blood pressures, blurred vision, and some facial droop.  She has had vertigo symptoms over the past several weeks and for the past few days has had some intermittent aphasia as well as dysarthria.  She awoke this morning and noted that she had some right facial droop while brushing her teeth at 7:30 AM. (per DO)   Clinical Impression   Pt agreeable to OT and PT co-evaluation. Pt near baseline level for functional ADL's and mobility, but reports continued blurry vision in L eye. No other visual deficits noted. WFL B UE strength and coordination. Pt is not recommended for further acute OT services but may benefit from neuro-ophthalmologist evaluation if vision does not fully return.  Pt will be discharged to care of nursing staff for remaining length of stay.       If plan is discharge home, recommend the following: Assist for transportation    Functional Status Assessment  Patient has not had a recent decline in their functional status  Equipment Recommendations  None recommended by OT    Recommendations for Other Services Other (comment) (Neuro opthalmologistt evaluation if vision does not return to normal.)     Precautions / Restrictions Precautions Precautions: None Restrictions Weight Bearing Restrictions: No      Mobility Bed Mobility Overal bed mobility: Independent                  Transfers Overall transfer level: Independent                        Balance Overall balance assessment: Mild deficits observed, not formally tested                                         ADL either performed or assessed with clinical judgement    ADL Overall ADL's : Independent                                             Vision Baseline Vision/History: 1 Wears glasses Ability to See in Adequate Light: 1 Impaired Patient Visual Report: Diplopia;Blurring of vision (Started with diplopia but is now blurry vision in L eye) Vision Assessment?: Yes Alignment/Gaze Preference: Within Defined Limits Tracking/Visual Pursuits: Able to track stimulus in all quads without difficulty Visual Fields: No apparent deficits Additional Comments: Pt reports difficulty reading small print due to blurry vision in L eye.     Perception Perception: Not tested       Praxis Praxis: WFL       Pertinent Vitals/Pain Pain Assessment Pain Assessment: No/denies pain     Extremity/Trunk Assessment Upper Extremity Assessment Upper Extremity Assessment: Overall WFL for tasks assessed   Lower Extremity Assessment Lower Extremity Assessment: Defer to PT evaluation   Cervical / Trunk Assessment Cervical / Trunk Assessment: Normal   Communication Communication Communication: No apparent difficulties   Cognition Arousal: Alert Behavior During Therapy: WFL for tasks assessed/performed Overall Cognitive Status: Within Functional Limits for tasks assessed  Home Living Family/patient expects to be discharged to:: Private residence Living Arrangements: Spouse/significant other Available Help at Discharge: Family;Available PRN/intermittently Type of Home: House Home Access: Stairs to enter [employer] of Steps: 1 Entrance Stairs-Rails: Left;Right;Can reach both Home Layout: One level     Bathroom Shower/Tub: Walk-in shower;Tub/shower unit   Bathroom Toilet: Handicapped height     Home Equipment: Cane - single point          Prior Functioning/Environment Prior Level of Function : Independent/Modified Independent              Mobility Comments: Independent ADLs Comments: Independent                                Co-evaluation PT/OT/SLP Co-Evaluation/Treatment: Yes Reason for Co-Treatment: To address functional/ADL transfers   OT goals addressed during session: ADL's and self-care      AM-PAC OT "6 Clicks" Daily Activity     Outcome Measure Help from another person eating meals?: None Help from another person taking care of personal grooming?: None Help from another person toileting, which includes using toliet, bedpan, or urinal?: None Help from another person bathing (including washing, rinsing, drying)?: None Help from another person to put on and taking off regular upper body clothing?: None Help from another person to put on and taking off regular lower body clothing?: None 6 Click Score: 24   End of Session    Activity Tolerance: Patient tolerated treatment well Patient left: in bed;with call bell/phone within reach;with family/visitor present  OT Visit Diagnosis: Other symptoms and signs involving the nervous system (B28.413)                Time: 2440-1027 OT Time Calculation (min): 14 min Charges:  OT General Charges $OT Visit: 1 Visit OT Evaluation $OT Eval Low Complexity: 1 Low  Janneth Krasner OT, MOT   [employer] 06/26/2023, 9:30 AM

## 2023-06-26 NOTE — Telephone Encounter (Signed)
Thirty day event monitor requested by Dr. Sherryll Burger. Pt enrolled in [employer] and order placed.

## 2023-06-26 NOTE — Progress Notes (Signed)
PROGRESS NOTE    Katrina Hodge  ZOX:[id number] DOB: [birth year] DOA: 06/25/2023 PCP: Elfredia Nevins, MD   Brief Narrative:    Katrina Hodge is a 67 y.o. female with medical history significant for hypertension presented to the ED from PCP office with elevated blood pressures, blurred vision, and some facial droop.  She has had vertigo symptoms over the past several weeks and for the past few days has had some intermittent aphasia as well as dysarthria.  She has been admitted with for acute left lateral pontine CVA and is now also noted to be diabetic with A1c 13.3%.  She continues to have elevated blood pressure readings today.  Assessment & Plan:   Principal Problem:   CVA (cerebral vascular accident) (HCC) Active Problems:   Essential hypertension  Assessment and Plan:   Acute left lateral pons CVA -CT angio head and neck with no LVO -2D echocardiogram with no concerning findings -30-day cardiac monitor on discharge -Continue atorvastatin 40 mg daily and use aspirin 81 mg/Plavix 75 mg for 3 weeks and then aspirin 81 mg daily thereafter -PT/OT/SLP evaluation with no home needs noted   Elevated hypertension -Secondary to CVA -Continue hydralazine with goal of normotension at this point -Resume home losartan   New diagnosis of type 2 diabetes -Diabetes education -SSI and carb modified diet -Plan for discharge on oral metformin/glucometer with close follow-up with PCP -Appreciate coordinator recommendations  DVT prophylaxis: SCDs Code Status: Full Family Communication: Family at bedside Disposition Plan:  Status is: Observation The patient will require care spanning > 2 midnights and should be moved to inpatient because: Need for IV medications   Consultants:  Neurology  Procedures:  None  Antimicrobials:  None   Subjective: Patient seen and evaluated today with no new acute complaints or concerns. No acute concerns or events noted  overnight.  Blood pressures have remained elevated and she is quite concerned.  Objective: Vitals:   06/26/23 0235 06/26/23 0300 06/26/23 0442 06/26/23 1130  BP: (!) 226/102 (!) 203/96 (!) 207/96 (!) 231/124  Pulse: 88 99 97 (!) 103  Resp:   18 18  Temp:   98.9 F (37.2 C) 98.3 F (36.8 C)  TempSrc:    Oral  SpO2:   100% 99%    Intake/Output Summary (Last 24 hours) at 06/26/2023 1542 Last data filed at 06/26/2023 1312 Gross per 24 hour  Intake 970.2 ml  Output --  Net 970.2 ml   There were no vitals filed for this visit.  Examination:  General exam: Appears calm and comfortable  Respiratory system: Clear to auscultation. Respiratory effort normal. Cardiovascular system: S1 & S2 heard, RRR.  Gastrointestinal system: Abdomen is soft Central nervous system: Alert and awake Extremities: No edema Skin: No significant lesions noted Psychiatry: Flat affect.    Data Reviewed: I have personally reviewed following labs and imaging studies  CBC: Recent Labs  Lab 06/25/23 0958  WBC 4.3  NEUTROABS 2.7  HGB 14.4  HCT 42.3  MCV 87.9  PLT 201   Basic Metabolic Panel: Recent Labs  Lab 06/25/23 0958  NA 137  K 4.1  CL 102  CO2 23  GLUCOSE 230*  BUN 10  CREATININE 0.88  CALCIUM 9.6   GFR: CrCl cannot be calculated (Unknown ideal weight.). Liver Function Tests: Recent Labs  Lab 06/25/23 0958  AST 25  ALT 22  ALKPHOS 96  BILITOT 1.1  PROT 8.1  ALBUMIN 4.5   No results for input(s): "LIPASE", "AMYLASE" in the  PROGRESS NOTE    Katrina Hodge  ZOX:[id number] DOB: [birth year] DOA: 06/25/2023 PCP: Elfredia Nevins, MD   Brief Narrative:    Katrina Hodge is a 67 y.o. female with medical history significant for hypertension presented to the ED from PCP office with elevated blood pressures, blurred vision, and some facial droop.  She has had vertigo symptoms over the past several weeks and for the past few days has had some intermittent aphasia as well as dysarthria.  She has been admitted with for acute left lateral pontine CVA and is now also noted to be diabetic with A1c 13.3%.  She continues to have elevated blood pressure readings today.  Assessment & Plan:   Principal Problem:   CVA (cerebral vascular accident) (HCC) Active Problems:   Essential hypertension  Assessment and Plan:   Acute left lateral pons CVA -CT angio head and neck with no LVO -2D echocardiogram with no concerning findings -30-day cardiac monitor on discharge -Continue atorvastatin 40 mg daily and use aspirin 81 mg/Plavix 75 mg for 3 weeks and then aspirin 81 mg daily thereafter -PT/OT/SLP evaluation with no home needs noted   Elevated hypertension -Secondary to CVA -Continue hydralazine with goal of normotension at this point -Resume home losartan   New diagnosis of type 2 diabetes -Diabetes education -SSI and carb modified diet -Plan for discharge on oral metformin/glucometer with close follow-up with PCP -Appreciate coordinator recommendations  DVT prophylaxis: SCDs Code Status: Full Family Communication: Family at bedside Disposition Plan:  Status is: Observation The patient will require care spanning > 2 midnights and should be moved to inpatient because: Need for IV medications   Consultants:  Neurology  Procedures:  None  Antimicrobials:  None   Subjective: Patient seen and evaluated today with no new acute complaints or concerns. No acute concerns or events noted  overnight.  Blood pressures have remained elevated and she is quite concerned.  Objective: Vitals:   06/26/23 0235 06/26/23 0300 06/26/23 0442 06/26/23 1130  BP: (!) 226/102 (!) 203/96 (!) 207/96 (!) 231/124  Pulse: 88 99 97 (!) 103  Resp:   18 18  Temp:   98.9 F (37.2 C) 98.3 F (36.8 C)  TempSrc:    Oral  SpO2:   100% 99%    Intake/Output Summary (Last 24 hours) at 06/26/2023 1542 Last data filed at 06/26/2023 1312 Gross per 24 hour  Intake 970.2 ml  Output --  Net 970.2 ml   There were no vitals filed for this visit.  Examination:  General exam: Appears calm and comfortable  Respiratory system: Clear to auscultation. Respiratory effort normal. Cardiovascular system: S1 & S2 heard, RRR.  Gastrointestinal system: Abdomen is soft Central nervous system: Alert and awake Extremities: No edema Skin: No significant lesions noted Psychiatry: Flat affect.    Data Reviewed: I have personally reviewed following labs and imaging studies  CBC: Recent Labs  Lab 06/25/23 0958  WBC 4.3  NEUTROABS 2.7  HGB 14.4  HCT 42.3  MCV 87.9  PLT 201   Basic Metabolic Panel: Recent Labs  Lab 06/25/23 0958  NA 137  K 4.1  CL 102  CO2 23  GLUCOSE 230*  BUN 10  CREATININE 0.88  CALCIUM 9.6   GFR: CrCl cannot be calculated (Unknown ideal weight.). Liver Function Tests: Recent Labs  Lab 06/25/23 0958  AST 25  ALT 22  ALKPHOS 96  BILITOT 1.1  PROT 8.1  ALBUMIN 4.5   No results for input(s): "LIPASE", "AMYLASE" in the  last 168 hours. No results for input(s): "AMMONIA" in the last 168 hours. Coagulation Profile: Recent Labs  Lab 06/25/23 0958  INR 1.1   Cardiac Enzymes: No results for input(s): "CKTOTAL", "CKMB", "CKMBINDEX", "TROPONINI" in the last 168 hours. BNP (last 3 results) No results for input(s): "PROBNP" in the last 8760 hours. HbA1C: Recent Labs    06/25/23 0958  HGBA1C 13.3*   CBG: Recent Labs  Lab 06/25/23 0952  GLUCAP 219*   Lipid  Profile: Recent Labs    06/26/23 0456  CHOL 241*  HDL 74  LDLCALC 146*  TRIG 104  CHOLHDL 3.3   Thyroid Function Tests: No results for input(s): "TSH", "T4TOTAL", "FREET4", "T3FREE", "THYROIDAB" in the last 72 hours. Anemia Panel: No results for input(s): "VITAMINB12", "FOLATE", "FERRITIN", "TIBC", "IRON", "RETICCTPCT" in the last 72 hours. Sepsis Labs: No results for input(s): "PROCALCITON", "LATICACIDVEN" in the last 168 hours.  No results found for this or any previous visit (from the past 240 hour(s)).       Radiology Studies: ECHOCARDIOGRAM COMPLETE  Result Date: 06/26/2023    ECHOCARDIOGRAM REPORT   Patient Name:   Katrina Hodge Date of Exam: 06/26/2023 Medical Rec #:  [id number]                Height:       62.0 in Accession #:    [id number]               Weight:       154.2 lb Date of Birth:  Jun 04, 1956                BSA:          1.712 m Patient Age:    66 years                 BP:           207/96 mmHg Patient Gender: F                        HR:           88 bpm. [facility] Procedure: 2D Echo, Cardiac Doppler and Color Doppler Indications:    Stroke I63.9  History:        Patient has no prior history of Echocardiogram examinations.                 Stroke; Risk Factors:Hypertension.  Sonographer:    Lucendia Herrlich RCS Referring Phys: [id number] Greely Atiyeh D [facility] IMPRESSIONS  1. Left ventricular ejection fraction, by estimation, is 60 to 65%. The left ventricle has normal function. The left ventricle has no regional wall motion abnormalities. There is mild left ventricular hypertrophy. Left ventricular diastolic parameters are consistent with Grade I diastolic dysfunction (impaired relaxation).  2. Right ventricular systolic function is normal. The right ventricular size is normal. There is normal pulmonary artery systolic pressure.  3. The mitral valve is normal in structure. No evidence of mitral valve regurgitation. No evidence of mitral stenosis.  4. The  aortic valve has an indeterminant number of cusps. Aortic valve regurgitation is not visualized. No aortic stenosis is present.  5. The inferior vena cava is normal in size with greater than 50% respiratory variability, suggesting right atrial pressure of 3 mmHg.  6. Cannot exclude a small PFO. Comparison(s): No prior Echocardiogram. FINDINGS  Left Ventricle: Left ventricular ejection fraction, by estimation, is 60 to 65%. The left ventricle has normal function.  last 168 hours. No results for input(s): "AMMONIA" in the last 168 hours. Coagulation Profile: Recent Labs  Lab 06/25/23 0958  INR 1.1   Cardiac Enzymes: No results for input(s): "CKTOTAL", "CKMB", "CKMBINDEX", "TROPONINI" in the last 168 hours. BNP (last 3 results) No results for input(s): "PROBNP" in the last 8760 hours. HbA1C: Recent Labs    06/25/23 0958  HGBA1C 13.3*   CBG: Recent Labs  Lab 06/25/23 0952  GLUCAP 219*   Lipid  Profile: Recent Labs    06/26/23 0456  CHOL 241*  HDL 74  LDLCALC 146*  TRIG 104  CHOLHDL 3.3   Thyroid Function Tests: No results for input(s): "TSH", "T4TOTAL", "FREET4", "T3FREE", "THYROIDAB" in the last 72 hours. Anemia Panel: No results for input(s): "VITAMINB12", "FOLATE", "FERRITIN", "TIBC", "IRON", "RETICCTPCT" in the last 72 hours. Sepsis Labs: No results for input(s): "PROCALCITON", "LATICACIDVEN" in the last 168 hours.  No results found for this or any previous visit (from the past 240 hour(s)).       Radiology Studies: ECHOCARDIOGRAM COMPLETE  Result Date: 06/26/2023    ECHOCARDIOGRAM REPORT   Patient Name:   Katrina Hodge Date of Exam: 06/26/2023 Medical Rec #:  [id number]                Height:       62.0 in Accession #:    [id number]               Weight:       154.2 lb Date of Birth:  Jun 04, 1956                BSA:          1.712 m Patient Age:    66 years                 BP:           207/96 mmHg Patient Gender: F                        HR:           88 bpm. [facility] Procedure: 2D Echo, Cardiac Doppler and Color Doppler Indications:    Stroke I63.9  History:        Patient has no prior history of Echocardiogram examinations.                 Stroke; Risk Factors:Hypertension.  Sonographer:    Lucendia Herrlich RCS Referring Phys: [id number] Greely Atiyeh D [facility] IMPRESSIONS  1. Left ventricular ejection fraction, by estimation, is 60 to 65%. The left ventricle has normal function. The left ventricle has no regional wall motion abnormalities. There is mild left ventricular hypertrophy. Left ventricular diastolic parameters are consistent with Grade I diastolic dysfunction (impaired relaxation).  2. Right ventricular systolic function is normal. The right ventricular size is normal. There is normal pulmonary artery systolic pressure.  3. The mitral valve is normal in structure. No evidence of mitral valve regurgitation. No evidence of mitral stenosis.  4. The  aortic valve has an indeterminant number of cusps. Aortic valve regurgitation is not visualized. No aortic stenosis is present.  5. The inferior vena cava is normal in size with greater than 50% respiratory variability, suggesting right atrial pressure of 3 mmHg.  6. Cannot exclude a small PFO. Comparison(s): No prior Echocardiogram. FINDINGS  Left Ventricle: Left ventricular ejection fraction, by estimation, is 60 to 65%. The left ventricle has normal function.  last 168 hours. No results for input(s): "AMMONIA" in the last 168 hours. Coagulation Profile: Recent Labs  Lab 06/25/23 0958  INR 1.1   Cardiac Enzymes: No results for input(s): "CKTOTAL", "CKMB", "CKMBINDEX", "TROPONINI" in the last 168 hours. BNP (last 3 results) No results for input(s): "PROBNP" in the last 8760 hours. HbA1C: Recent Labs    06/25/23 0958  HGBA1C 13.3*   CBG: Recent Labs  Lab 06/25/23 0952  GLUCAP 219*   Lipid  Profile: Recent Labs    06/26/23 0456  CHOL 241*  HDL 74  LDLCALC 146*  TRIG 104  CHOLHDL 3.3   Thyroid Function Tests: No results for input(s): "TSH", "T4TOTAL", "FREET4", "T3FREE", "THYROIDAB" in the last 72 hours. Anemia Panel: No results for input(s): "VITAMINB12", "FOLATE", "FERRITIN", "TIBC", "IRON", "RETICCTPCT" in the last 72 hours. Sepsis Labs: No results for input(s): "PROCALCITON", "LATICACIDVEN" in the last 168 hours.  No results found for this or any previous visit (from the past 240 hour(s)).       Radiology Studies: ECHOCARDIOGRAM COMPLETE  Result Date: 06/26/2023    ECHOCARDIOGRAM REPORT   Patient Name:   Katrina Hodge Date of Exam: 06/26/2023 Medical Rec #:  [id number]                Height:       62.0 in Accession #:    [id number]               Weight:       154.2 lb Date of Birth:  Jun 04, 1956                BSA:          1.712 m Patient Age:    66 years                 BP:           207/96 mmHg Patient Gender: F                        HR:           88 bpm. [facility] Procedure: 2D Echo, Cardiac Doppler and Color Doppler Indications:    Stroke I63.9  History:        Patient has no prior history of Echocardiogram examinations.                 Stroke; Risk Factors:Hypertension.  Sonographer:    Lucendia Herrlich RCS Referring Phys: [id number] Greely Atiyeh D [facility] IMPRESSIONS  1. Left ventricular ejection fraction, by estimation, is 60 to 65%. The left ventricle has normal function. The left ventricle has no regional wall motion abnormalities. There is mild left ventricular hypertrophy. Left ventricular diastolic parameters are consistent with Grade I diastolic dysfunction (impaired relaxation).  2. Right ventricular systolic function is normal. The right ventricular size is normal. There is normal pulmonary artery systolic pressure.  3. The mitral valve is normal in structure. No evidence of mitral valve regurgitation. No evidence of mitral stenosis.  4. The  aortic valve has an indeterminant number of cusps. Aortic valve regurgitation is not visualized. No aortic stenosis is present.  5. The inferior vena cava is normal in size with greater than 50% respiratory variability, suggesting right atrial pressure of 3 mmHg.  6. Cannot exclude a small PFO. Comparison(s): No prior Echocardiogram. FINDINGS  Left Ventricle: Left ventricular ejection fraction, by estimation, is 60 to 65%. The left ventricle has normal function.  PROGRESS NOTE    Katrina Hodge  ZOX:[id number] DOB: [birth year] DOA: 06/25/2023 PCP: Elfredia Nevins, MD   Brief Narrative:    Katrina Hodge is a 67 y.o. female with medical history significant for hypertension presented to the ED from PCP office with elevated blood pressures, blurred vision, and some facial droop.  She has had vertigo symptoms over the past several weeks and for the past few days has had some intermittent aphasia as well as dysarthria.  She has been admitted with for acute left lateral pontine CVA and is now also noted to be diabetic with A1c 13.3%.  She continues to have elevated blood pressure readings today.  Assessment & Plan:   Principal Problem:   CVA (cerebral vascular accident) (HCC) Active Problems:   Essential hypertension  Assessment and Plan:   Acute left lateral pons CVA -CT angio head and neck with no LVO -2D echocardiogram with no concerning findings -30-day cardiac monitor on discharge -Continue atorvastatin 40 mg daily and use aspirin 81 mg/Plavix 75 mg for 3 weeks and then aspirin 81 mg daily thereafter -PT/OT/SLP evaluation with no home needs noted   Elevated hypertension -Secondary to CVA -Continue hydralazine with goal of normotension at this point -Resume home losartan   New diagnosis of type 2 diabetes -Diabetes education -SSI and carb modified diet -Plan for discharge on oral metformin/glucometer with close follow-up with PCP -Appreciate coordinator recommendations  DVT prophylaxis: SCDs Code Status: Full Family Communication: Family at bedside Disposition Plan:  Status is: Observation The patient will require care spanning > 2 midnights and should be moved to inpatient because: Need for IV medications   Consultants:  Neurology  Procedures:  None  Antimicrobials:  None   Subjective: Patient seen and evaluated today with no new acute complaints or concerns. No acute concerns or events noted  overnight.  Blood pressures have remained elevated and she is quite concerned.  Objective: Vitals:   06/26/23 0235 06/26/23 0300 06/26/23 0442 06/26/23 1130  BP: (!) 226/102 (!) 203/96 (!) 207/96 (!) 231/124  Pulse: 88 99 97 (!) 103  Resp:   18 18  Temp:   98.9 F (37.2 C) 98.3 F (36.8 C)  TempSrc:    Oral  SpO2:   100% 99%    Intake/Output Summary (Last 24 hours) at 06/26/2023 1542 Last data filed at 06/26/2023 1312 Gross per 24 hour  Intake 970.2 ml  Output --  Net 970.2 ml   There were no vitals filed for this visit.  Examination:  General exam: Appears calm and comfortable  Respiratory system: Clear to auscultation. Respiratory effort normal. Cardiovascular system: S1 & S2 heard, RRR.  Gastrointestinal system: Abdomen is soft Central nervous system: Alert and awake Extremities: No edema Skin: No significant lesions noted Psychiatry: Flat affect.    Data Reviewed: I have personally reviewed following labs and imaging studies  CBC: Recent Labs  Lab 06/25/23 0958  WBC 4.3  NEUTROABS 2.7  HGB 14.4  HCT 42.3  MCV 87.9  PLT 201   Basic Metabolic Panel: Recent Labs  Lab 06/25/23 0958  NA 137  K 4.1  CL 102  CO2 23  GLUCOSE 230*  BUN 10  CREATININE 0.88  CALCIUM 9.6   GFR: CrCl cannot be calculated (Unknown ideal weight.). Liver Function Tests: Recent Labs  Lab 06/25/23 0958  AST 25  ALT 22  ALKPHOS 96  BILITOT 1.1  PROT 8.1  ALBUMIN 4.5   No results for input(s): "LIPASE", "AMYLASE" in the

## 2023-06-26 NOTE — Evaluation (Signed)
Speech Language Pathology Evaluation Patient Details Name: Katrina Hodge MRN: [id number] DOB: Feb 24, 1956 Today's Date: 06/26/2023 Time: 8546-2703 SLP Time Calculation (min) (ACUTE ONLY): 25 min  Problem List:  Patient Active Problem List   Diagnosis Date Noted   CVA (cerebral vascular accident) (HCC) 06/25/2023   Special screening for malignant neoplasms, colon 09/08/2018   Essential hypertension 10/08/2016   Past Medical History:  Past Medical History:  Diagnosis Date   Essential hypertension 10/08/2016   Past Surgical History:  Past Surgical History:  Procedure Laterality Date   PARTIAL HYSTERECTOMY     fibroid tumors   HPI:  Katrina Hodge is a 67 y.o. female with medical history significant for hypertension presented to the ED from PCP office with elevated blood pressures, blurred vision, and some facial droop.  She has had vertigo symptoms over the past several weeks and for the past few days has had some intermittent aphasia as well as dysarthria.  She awoke this morning and noted that she had some right facial droop while brushing her teeth at 7:30 AM. Brain MRI shows an acute infarct in the left lateral pons and middle cerebellar peduncle without hemorrhage or mass effect.  SLE requested   Assessment / Plan / Recommendation Clinical Impression  Speech and language evaluation completed while Pt was sitting upright in bed. Pt presents with mild dysarthria with slightly decreased articulatory precision secondary to left facial droop. Note intelligibility was still noted to be 100% accurate in conversation and throughout my assessment. Pt presents without aphasia and cognition was assessed via the [facility] SLUMS assessment. Pt achieved a score of 26/30 indicating her cognition is within normal limits. Detailed report of [facility] SLUMS below. SLP reviewed strategies including over-articulation and slowed rate and note Pt was already using these strategies to facilitate  intelligibility. There are no further ST needs noted at this time, our service will sign off. Thank you.   [facility] SLUMS Examination Orientation  3/3  Numeric Problem Solving  1/3  Memory  5/5  Attention 2/2  Thought Organization 3/3  Clock Drawing 4/4  Visuospatial Skills               2/2  Short Story Recall  6/8  Total  26/30     Scoring  High School Education  Less than High School Education   Normal  27-30 25-30  Mild Neurocognitive Disorder 21-26 20-24  Dementia  1-20 1-19      SLP Assessment  SLP Recommendation/Assessment: Patient does not need any further Speech Lanaguage Pathology Services SLP Visit Diagnosis: Dysarthria and anarthria (R47.1)    Recommendations for follow up therapy are one component of a multi-disciplinary discharge planning process, led by the attending physician.  Recommendations may be updated based on patient status, additional functional criteria and insurance authorization.    Follow Up Recommendations  No SLP follow up    Assistance Recommended at Discharge  None           SLP Evaluation Cognition  Overall Cognitive Status: Within Functional Limits for tasks assessed Arousal/Alertness: Awake/alert Orientation Level: Oriented X4 Year: 2024 Month: September Day of Week: Correct Attention: Focused Focused Attention: Appears intact Memory: Appears intact Awareness: Appears intact Problem Solving: Appears intact       Comprehension  Auditory Comprehension Overall Auditory Comprehension: Appears within functional limits for tasks assessed Yes/No Questions: Within Functional Limits Commands: Within Functional Limits Visual Recognition/Discrimination Discrimination: Not tested Reading Comprehension Reading Status: Not tested    Expression Expression Primary  Mode of Expression: Verbal Verbal Expression Overall Verbal Expression: Appears within functional limits for tasks assessed Written Expression Dominant Hand: Right Written  Expression: Within Functional Limits   Oral / Motor  Motor Speech Overall Motor Speech: Impaired Articulation: Impaired Level of Impairment: Word Intelligibility: Intelligible            Leroy Pettway H. Romie Levee, CCC-SLP Speech Language Pathologist   Georgetta Haber 06/26/2023, 12:37 PM

## 2023-06-26 NOTE — Consult Note (Signed)
I connected with  Katrina Hodge on 06/26/23 by a video enabled telemedicine application and verified that I am speaking with the correct person using two identifiers.   I discussed the limitations of evaluation and management by telemedicine. The patient expressed understanding and agreed to proceed.  Location of patient: [facility] Care physician: Katrina Hodge  Neurology Consultation Reason for Consult: Stroke Referring Physician: Dr. Estanislado Pandy  CC: Vertigo, slurred speech, blurred vision  History is obtained from: Patient, chart review  HPI: Katrina Hodge is a 67 y.o. female with past medical history of hypertension who presented yesterday from PCPs office due to elevated blood pressure.  Patient states she has had vertigo and blurred vision in her left eye as well as right eye intermittently for almost a month now.  States she has been seeing an ENT doctor for vertigo and left ear fullness and was told that everything is fine.  ENT told her to see ophthalmology for her blurred vision.  Reports seeing an ophthalmologist also said everything is fine.  Eventually she went to see her PCP yesterday who noticed that her blood pressure was elevated and told her to come to emergency room.  Last known normal: Unclear Event happened at home No tPA as outside window No thrombectomy as no large cell occlusion mRS 0   ROS: All other systems reviewed and negative except as noted in the HPI.   Past Medical History:  Diagnosis Date   Essential hypertension 10/08/2016    Family History  Problem Relation Age of Onset   Breast cancer Neg Hx     Social History:  reports that she has never smoked. She has never used smokeless tobacco. She reports that she does not drink alcohol and does not use drugs.   Medications Prior to Admission  Medication Sig Dispense Refill Last Dose   Cholecalciferol (VITAMIN D3) 25 MCG (1000 UT) CAPS Take 2,000 Units by mouth  daily.   06/24/2023   doxycycline (VIBRAMYCIN) 100 MG capsule Take 50 mg by mouth 2 (two) times daily.   06/25/2023   fluticasone (FLONASE) 50 MCG/ACT nasal spray Place 1 spray into both nostrils daily.   unknown   ibuprofen (ADVIL,MOTRIN) 200 MG tablet Take 200 mg by mouth every 6 (six) hours as needed.   unknown   losartan (COZAAR) 100 MG tablet Take 100 mg by mouth daily.   06/25/2023   Pseudoephedrine-Acetaminophen (DILOTAB PO) Take by mouth as needed.   unknown      Exam: Current vital signs: BP (!) 231/124 (BP Location: Left Arm)   Pulse (!) 103   Temp 98.3 F (36.8 C) (Oral)   Resp 18   SpO2 99%  Vital signs in last 24 hours: Temp:  [97.9 F (36.6 C)-98.9 F (37.2 C)] 98.3 F (36.8 C) (09/20 1130) Pulse Rate:  [71-103] 103 (09/20 1130) Resp:  [15-26] 18 (09/20 1130) BP: (167-254)/(85-185) 231/124 (09/20 1130) SpO2:  [96 %-100 %] 99 % (09/20 1130)   Physical Exam  Constitutional: Appears well-developed and well-nourished.  Neuro: AOx3, no aphasia, cranial nerves II to XII are grossly intact except right facial droop, antigravity strength without drift in all 4 extremities, sensation intact to light touch, FTN intact bilaterally  NIHSS 2  INPUTS: 1A: Level of consciousness --> 0 = Alert; keenly responsive 1B: Ask month and age --> 0 = Both questions right 1C: 'Blink eyes' & 'squeeze hands' --> 0 = Performs both tasks 2: Horizontal extraocular movements --> 0 =  Normal 3: Visual fields --> 0 = No visual loss 4: Facial palsy --> 2 = Partial paralysis (lower face) 5A: Left arm motor drift --> 0 = No drift for 10 seconds 5B: Right arm motor drift --> 0 = No drift for 10 seconds 6A: Left leg motor drift --> 0 = No drift for 5 seconds 6B: Right leg motor drift --> 0 = No drift for 5 seconds 7: Limb Ataxia --> 0 = No ataxia 8: Sensation --> 0 = Normal; no sensory loss 9: Language/aphasia --> 0 = Normal; no aphasia 10: Dysarthria --> 0 = Normal 11: Extinction/inattention -->  0 = No abnormality   I have reviewed labs in epic and the results pertinent to this consultation are: CBC:  Recent Labs  Lab 06/25/23 0958  WBC 4.3  NEUTROABS 2.7  HGB 14.4  HCT 42.3  MCV 87.9  PLT 201    Basic Metabolic Panel:  Lab Results  Component Value Date   NA 137 06/25/2023   K 4.1 06/25/2023   CO2 23 06/25/2023   GLUCOSE 230 (H) 06/25/2023   BUN 10 06/25/2023   CREATININE 0.88 06/25/2023   CALCIUM 9.6 06/25/2023   GFRNONAA >60 06/25/2023   Lipid Panel:  Lab Results  Component Value Date   LDLCALC 146 (H) 06/26/2023   HgbA1c:  Lab Results  Component Value Date   HGBA1C 13.3 (H) 06/25/2023   Urine Drug Screen:     Component Value Date/Time   LABOPIA NONE DETECTED 06/25/2023 0958   COCAINSCRNUR NONE DETECTED 06/25/2023 0958   LABBENZ NONE DETECTED 06/25/2023 0958   AMPHETMU NONE DETECTED 06/25/2023 0958   THCU NONE DETECTED 06/25/2023 0958   LABBARB NONE DETECTED 06/25/2023 0958    Alcohol Level     Component Value Date/Time   ETH <10 06/25/2023 0958     I have reviewed the images obtained:  CT angio Head and Neck with contrast:The major cerebellar arteries including the left PICA and PICA appear patent. No acute vascular finding. Mild intracranial atherosclerotic irregularity and narrowing of the anterior and posterior circulations without proximal high-grade stenosis or occlusion. No significant atherosclerotic disease in the neck.  MRI Brain and MRA head without contrast 06/25/2023:  Acute infarct in the left lateral pons and middle cerebellar peduncle. No acute hemorrhage or significant mass effect. The left AICA and PICA are not visualized, which may be due to occlusion or slow flow. Consider CTA head and neck.  TTE 06/26/2023: Left ventricular ejection fraction, by estimation, is 60 to 65%.  The left ventricle has no regional wall motion abnormalities. Cannot exclude a small PFO.     ASSESSMENT/PLAN: 67 year old female brought in due to  vertigo and blurred vision in left eye.  Acute ischemic stroke -Etiology: Likely embolic   Recommendations: -Recommend 30-day cardiac monitor -Would recommend aspirin 81 mg daily and Plavix 75 mg daily for 3 weeks followed by aspirin 81 mg daily -Recommend atorvastatin 40mg  daily - Management of HTN DM per primary team - PT/OT - Goal BP: normotension - F/u with neuro in 8-12 weeks ( ordered) - Discussed plan eith family, patient and Dr Sherryll Burger   Thank you for allowing [place] to participate in the care of this patient. If you have any further questions, please contact  me or neurohospitalist.   Lindie Spruce [facility]

## 2023-06-26 NOTE — Evaluation (Signed)
Physical Therapy Evaluation Patient Details Name: Katrina Hodge MRN: [id number] DOB: June 08, 1956 Today's Date: 06/26/2023  History of Present Illness  Katrina Hodge is a 67 y.o. female with medical history significant for hypertension presented to the ED from PCP office with elevated blood pressures, blurred vision, and some facial droop.  She has had vertigo symptoms over the past several weeks and for the past few days has had some intermittent aphasia as well as dysarthria.  She awoke this morning and noted that she had some right facial droop while brushing her teeth at 7:30 AM.   Clinical Impression  Patient functioning at baseline for functional mobility and gait demonstrating good return for ambulating in room, hallway and stairs without loss of balance.  Plan:  Patient discharged from physical therapy to care of nursing for ambulation daily as tolerated for length of stay.          If plan is discharge home, recommend the following: Other (comment) (at baseline)   Can travel by private vehicle        Equipment Recommendations None recommended by PT  Recommendations for Other Services       Functional Status Assessment Patient has not had a recent decline in their functional status     Precautions / Restrictions Precautions Precautions: None Restrictions Weight Bearing Restrictions: No      Mobility  Bed Mobility Overal bed mobility: Independent                  Transfers Overall transfer level: Independent                      Ambulation/Gait Ambulation/Gait assistance: Independent, Modified independent (Device/Increase time) Gait Distance (Feet): 200 Feet Assistive device: None Gait Pattern/deviations: WFL(Within Functional Limits) Gait velocity: normal     General Gait Details: demonstrates good return for ambulating in room, hallways, stairs without loss of balance  Stairs Stairs: Yes   Stair Management: One rail  Right, Alternating pattern Number of Stairs: 4 General stair comments: good return demonstrated for going up/down stairs using 1 siderail without loss of balance  Wheelchair Mobility     Tilt Bed    Modified Rankin (Stroke Patients Only)       Balance Overall balance assessment: Mild deficits observed, not formally tested                                           Pertinent Vitals/Pain Pain Assessment Pain Assessment: No/denies pain    Home Living Family/patient expects to be discharged to:: Private residence Living Arrangements: Spouse/significant other Available Help at Discharge: Family;Available PRN/intermittently Type of Home: House Home Access: Stairs to enter Entrance Stairs-Rails: Left;Right;Can reach both Entrance Stairs-Number of Steps: 1   Home Layout: One level Home Equipment: Cane - single point      Prior Function Prior Level of Function : Independent/Modified Independent;Driving             Mobility Comments: Community ambulator without AD, drives, shops ADLs Comments: Independent     Extremity/Trunk Assessment   Upper Extremity Assessment Upper Extremity Assessment: Defer to OT evaluation    Lower Extremity Assessment Lower Extremity Assessment: Overall WFL for tasks assessed    Cervical / Trunk Assessment Cervical / Trunk Assessment: Normal  Communication   Communication Communication: No apparent difficulties  Cognition Arousal: Alert Behavior During Therapy: [facility]  for tasks assessed/performed Overall Cognitive Status: Within Functional Limits for tasks assessed                                          General Comments      Exercises     Assessment/Plan    PT Assessment Patient does not need any further PT services  PT Problem List         PT Treatment Interventions      PT Goals (Current goals can be found in the Care Plan section)  Acute Rehab PT Goals Patient Stated Goal: return  home with family to assist PT Goal Formulation: With patient/family Time For Goal Achievement: 06/26/23 Potential to Achieve Goals: Good    Frequency       Co-evaluation PT/OT/SLP Co-Evaluation/Treatment: Yes Reason for Co-Treatment: To address functional/ADL transfers PT goals addressed during session: Mobility/safety with mobility;Balance OT goals addressed during session: ADL's and self-care       AM-PAC PT "6 Clicks" Mobility  Outcome Measure Help needed turning from your back to your side while in a flat bed without using bedrails?: None Help needed moving from lying on your back to sitting on the side of a flat bed without using bedrails?: None Help needed moving to and from a bed to a chair (including a wheelchair)?: None Help needed standing up from a chair using your arms (e.g., wheelchair or bedside chair)?: None Help needed to walk in hospital room?: None Help needed climbing 3-5 steps with a railing? : None 6 Click Score: 24    End of Session   Activity Tolerance: Patient tolerated treatment well Patient left: in bed;with call bell/phone within reach Nurse Communication: Mobility status PT Visit Diagnosis: Unsteadiness on feet (R26.81);Other abnormalities of gait and mobility (R26.89);Muscle weakness (generalized) (M62.81)    Time: 0810-0830 PT Time Calculation (min) (ACUTE ONLY): 20 min   Charges:   PT Evaluation $PT Eval Moderate Complexity: 1 Mod PT Treatments $Therapeutic Activity: 8-22 mins PT General Charges $$ ACUTE PT VISIT: 1 Visit         12:07 PM, 06/26/23 Ocie Bob, MPT Physical Therapist with [facility] [phone] office [phone] mobile phone

## 2023-06-26 NOTE — Progress Notes (Signed)
   06/26/23 1130  Assess: MEWS Score  Temp 98.3 F (36.8 C)  BP (!) 231/124  MAP (mmHg) 153  Pulse Rate (!) 103  Resp 18  Level of Consciousness Alert  SpO2 99 %  O2 Device Room Air  Assess: MEWS Score  MEWS Temp 0  MEWS Systolic 2  MEWS Pulse 1  MEWS RR 0  MEWS LOC 0  MEWS Score 3  MEWS Score Color Yellow  Assess: if the MEWS score is Yellow or Red  Were vital signs accurate and taken at a resting state? Yes  Does the patient meet 2 or more of the SIRS criteria? No  MEWS guidelines implemented  No, previously yellow, continue vital signs every 4 hours  Provider Notification  Provider response  (MD not contacted,  allowing bp of 220.   Gave apresoline prn)  Assess: SIRS CRITERIA  SIRS Temperature  0  SIRS Pulse 1  SIRS Respirations  0  SIRS WBC 0  SIRS Score Sum  1

## 2023-06-26 NOTE — Plan of Care (Signed)
Problem: Education: Goal: Knowledge of disease or condition will improve Outcome: Progressing Goal: Knowledge of secondary prevention will improve (MUST DOCUMENT ALL) Outcome: Progressing Goal: Knowledge of patient specific risk factors will improve Loraine Leriche N/A or DELETE if not current risk factor) Outcome: Progressing   Problem: Ischemic Stroke/TIA Tissue Perfusion: Goal: Complications of ischemic stroke/TIA will be minimized Outcome: Progressing   Problem: Coping: Goal: Will verbalize positive feelings about self Outcome: Progressing Goal: Will identify appropriate support needs Outcome: Progressing   Problem: Health Behavior/Discharge Planning: Goal: Ability to manage health-related needs will improve Outcome: Progressing Goal: Goals will be collaboratively established with patient/family Outcome: Progressing   Problem: Self-Care: Goal: Ability to participate in self-care as condition permits will improve Outcome: Progressing Goal: Verbalization of feelings and concerns over difficulty with self-care will improve Outcome: Progressing Goal: Ability to communicate needs accurately will improve Outcome: Progressing   Problem: Nutrition: Goal: Risk of aspiration will decrease Outcome: Progressing Goal: Dietary intake will improve Outcome: Progressing   Problem: Education: Goal: Knowledge of General Education information will improve Description: Including pain rating scale, medication(s)/side effects and non-pharmacologic comfort measures Outcome: Progressing   Problem: Health Behavior/Discharge Planning: Goal: Ability to manage health-related needs will improve Outcome: Progressing   Problem: Clinical Measurements: Goal: Ability to maintain clinical measurements within normal limits will improve Outcome: Progressing

## 2023-06-27 DIAGNOSIS — I639 Cerebral infarction, unspecified: Secondary | ICD-10-CM | POA: Diagnosis not present

## 2023-06-27 LAB — GLUCOSE, CAPILLARY
Glucose-Capillary: 238 mg/dL — ABNORMAL HIGH (ref 70–99)
Glucose-Capillary: 219 mg/dL — ABNORMAL HIGH (ref 70–99)
Glucose-Capillary: 225 mg/dL — ABNORMAL HIGH (ref 70–99)

## 2023-06-27 MED ORDER — BLOOD GLUCOSE TEST VI STRP: 1.0000 | ORAL_STRIP | Freq: Three times a day (TID) | 0 refills | Status: AC

## 2023-06-27 MED ORDER — CLOPIDOGREL BISULFATE 75 MG PO TABS: 75.0000 mg | ORAL_TABLET | Freq: Every day | ORAL | 0 refills | Status: AC

## 2023-06-27 MED ORDER — AMLODIPINE BESYLATE 5 MG PO TABS: 5.0000 mg | ORAL_TABLET | Freq: Every day | ORAL | 1 refills | Status: AC

## 2023-06-27 MED ORDER — LANCET DEVICE MISC: 1.0000 | Freq: Three times a day (TID) | 0 refills | Status: AC

## 2023-06-27 MED ORDER — AMLODIPINE BESYLATE 5 MG PO TABS
5.0000 mg | ORAL_TABLET | Freq: Every day | ORAL | Status: DC
  Administered 2023-06-27: 5 mg via ORAL
  Filled 2023-06-27: qty 1

## 2023-06-27 MED ORDER — METFORMIN HCL 500 MG PO TABS: 500.0000 mg | ORAL_TABLET | Freq: Two times a day (BID) | ORAL | 1 refills | Status: AC

## 2023-06-27 MED ORDER — ATORVASTATIN CALCIUM 40 MG PO TABS: 40.0000 mg | ORAL_TABLET | Freq: Every day | ORAL | 1 refills | Status: AC

## 2023-06-27 MED ORDER — LANCETS MISC. MISC: 1.0000 | Freq: Three times a day (TID) | 0 refills | Status: AC

## 2023-06-27 MED ORDER — ONDANSETRON HCL 4 MG PO TABS: 4.0000 mg | ORAL_TABLET | Freq: Every day | ORAL | 1 refills | Status: DC | PRN

## 2023-06-27 MED ORDER — ASPIRIN 81 MG PO CHEW: 81.0000 mg | CHEWABLE_TABLET | Freq: Every day | ORAL | 1 refills | Status: AC

## 2023-06-27 MED ORDER — BLOOD GLUCOSE MONITORING SUPPL DEVI: 1.0000 | Freq: Three times a day (TID) | 0 refills | Status: AC

## 2023-06-27 NOTE — Progress Notes (Signed)
   06/27/23 0459  Assess: MEWS Score  Temp 99.2 F (37.3 C)  BP (!) 186/93  MAP (mmHg) 117  Pulse Rate (!) 113  Resp (!) 21  SpO2 99 %  O2 Device Room Air  Assess: MEWS Score  MEWS Temp 0  MEWS Systolic 0  MEWS Pulse 2  MEWS RR 1  MEWS LOC 0  MEWS Score 3  MEWS Score Color Yellow  Assess: if the MEWS score is Yellow or Red  Were vital signs accurate and taken at a resting state? Yes  Does the patient meet 2 or more of the SIRS criteria? No  MEWS guidelines implemented  No, previously yellow, continue vital signs every 4 hours  Assess: SIRS CRITERIA  SIRS Temperature  0  SIRS Pulse 1  SIRS Respirations  1  SIRS WBC 0  SIRS Score Sum  2

## 2023-06-27 NOTE — Progress Notes (Signed)
   06/27/23 0100  Assess: MEWS Score  Temp 98.5 F (36.9 C)  BP (!) 196/95  MAP (mmHg) 117  Pulse Rate (!) 113  Resp (!) 21  SpO2 98 %  O2 Device Room Air  Assess: MEWS Score  MEWS Temp 0  MEWS Systolic 0  MEWS Pulse 2  MEWS RR 1  MEWS LOC 0  MEWS Score 3  MEWS Score Color Yellow  Assess: if the MEWS score is Yellow or Red  Were vital signs accurate and taken at a resting state? Yes  Does the patient meet 2 or more of the SIRS criteria? No  MEWS guidelines implemented  No, previously yellow, continue vital signs every 4 hours  Assess: SIRS CRITERIA  SIRS Temperature  0  SIRS Pulse 1  SIRS Respirations  1  SIRS WBC 0  SIRS Score Sum  2

## 2023-06-27 NOTE — Discharge Summary (Signed)
Physician Discharge Summary  Katrina Hodge FAO:[id number] DOB: [birth year] DOA: 06/25/2023  PCP: Elfredia Nevins, MD  Admit date: 06/25/2023  Discharge date: 06/27/2023  Admitted From:Home  Disposition:  Home  Recommendations for Outpatient Follow-up:  Follow up with PCP in 1-2 weeks Monitor BP closely and with PCP.  Resumed home losartan and prescribed amlodipine Follow-up with neurology in 3 months with referral sent Cardiac monitor for 30 days Aspirin and Plavix for 3 weeks and then aspirin only thereafter Continue on atorvastatin Continue on metformin and glucometer kit prescribed, follow-up with PCP regarding blood glucose control and follow-up A1c in the near future Zofran provided as needed for nausea or vomiting  Home Health: Yes with SLP  Equipment/Devices: None  Discharge Condition:Stable  CODE STATUS: Full  Diet recommendation: Heart Healthy/carb modified  Brief/Interim Summary: Katrina Hodge is a 67 y.o. female with medical history significant for hypertension presented to the ED from PCP office with elevated blood pressures, blurred vision, and some facial droop.  She has had vertigo symptoms over the past several weeks and for the past few days has had some intermittent aphasia as well as dysarthria.  She has been admitted with for acute left lateral pontine CVA and is now also noted to be diabetic with A1c 13.3%.  She underwent diabetes education and was noted to have elevated blood pressure readings and therefore remain on other night until blood pressures were better controlled.  She was also noted to have some intermittent nausea and vomiting which have now improved.  She will be started on medications as noted above with close follow-up.  2D echocardiogram with no acute findings and she will be set up with 30-day event monitor.  No other acute events or concerns noted throughout the course of the stay.  Discharge Diagnoses:  Principal Problem:    CVA (cerebral vascular accident) [facility]) Active Problems:   Essential hypertension  Principal discharge diagnosis: Acute left lateral pons CVA and new diagnosis of type 2 diabetes.  Discharge Instructions  Discharge Instructions     Ambulatory referral to Neurology   Complete by: As directed    An appointment is requested in approximately: 8 -12 weeks   Ambulatory referral to Nutrition and Diabetic Education   Complete by: As directed    Diet - low sodium heart healthy   Complete by: As directed    Increase activity slowly   Complete by: As directed       Allergies as of 06/27/2023       Reactions   Levaquin [levofloxacin In D5w]    rash        Medication List     STOP taking these medications    DILOTAB PO   doxycycline 100 MG capsule Commonly known as: VIBRAMYCIN       TAKE these medications    amLODipine 5 MG tablet Commonly known as: NORVASC Take 1 tablet (5 mg total) by mouth daily. Start taking on: June 28, 2023   aspirin 81 MG chewable tablet Chew 1 tablet (81 mg total) by mouth daily. Start taking on: June 28, 2023   atorvastatin 40 MG tablet Commonly known as: LIPITOR Take 1 tablet (40 mg total) by mouth daily. Start taking on: June 28, 2023   Blood Glucose Monitoring Suppl Devi 1 each by Does not apply route in the morning, at noon, and at bedtime. May substitute to any manufacturer covered by patient's insurance.   BLOOD GLUCOSE TEST STRIPS Strp 1 each by In Vitro  route in the morning, at noon, and at bedtime. May substitute to any manufacturer covered by patient's insurance.   clopidogrel 75 MG tablet Commonly known as: PLAVIX Take 1 tablet (75 mg total) by mouth daily for 21 days. Start taking on: June 28, 2023   fluticasone 50 MCG/ACT nasal spray Commonly known as: FLONASE Place 1 spray into both nostrils daily.   ibuprofen 200 MG tablet Commonly known as: ADVIL Take 200 mg by mouth every 6 (six) hours as  needed.   Lancet Device Misc 1 each by Does not apply route in the morning, at noon, and at bedtime. May substitute to any manufacturer covered by patient's insurance.   Lancets Misc. Misc 1 each by Does not apply route in the morning, at noon, and at bedtime. May substitute to any manufacturer covered by patient's insurance.   losartan 100 MG tablet Commonly known as: COZAAR Take 100 mg by mouth daily.   metFORMIN 500 MG tablet Commonly known as: GLUCOPHAGE Take 1 tablet (500 mg total) by mouth 2 (two) times daily with a meal.   ondansetron 4 MG tablet Commonly known as: Zofran Take 1 tablet (4 mg total) by mouth daily as needed for nausea or vomiting.   Vitamin D3 25 MCG (1000 UT) Caps Take 2,000 Units by mouth daily.        Follow-up Information     Elfredia Nevins, MD. Schedule an appointment as soon as possible for a visit in 1 week(s).   Specialty: Internal Medicine Contact information: [address] [place] [address] [phone]                Allergies  Allergen Reactions   Levaquin [Levofloxacin In D5w]     rash    Consultations: Neurology   Procedures/Studies: ECHOCARDIOGRAM COMPLETE  Result Date: 06/26/2023    ECHOCARDIOGRAM REPORT   Patient Name:   Katrina Hodge Date of Exam: 06/26/2023 Medical Rec #:  [id number]                Height:       62.0 in Accession #:    [id number]               Weight:       154.2 lb Date of Birth:  1955-10-15                BSA:          1.712 m Patient Age:    66 years                 BP:           207/96 mmHg Patient Gender: F                        HR:           88 bpm. [facility] Procedure: 2D Echo, Cardiac Doppler and Color Doppler Indications:    Stroke I63.9  History:        Patient has no prior history of Echocardiogram examinations.                 Stroke; Risk Factors:Hypertension.  Sonographer:    Lucendia Herrlich RCS Referring Phys: [id number] Tionne Dayhoff D [place] IMPRESSIONS  1.  Left ventricular ejection fraction, by estimation, is 60 to 65%. The left ventricle has normal function. The left ventricle has no regional wall motion abnormalities. There is mild left ventricular hypertrophy. Left  ventricular diastolic parameters are consistent with Grade I diastolic dysfunction (impaired relaxation).  2. Right ventricular systolic function is normal. The right ventricular size is normal. There is normal pulmonary artery systolic pressure.  3. The mitral valve is normal in structure. No evidence of mitral valve regurgitation. No evidence of mitral stenosis.  4. The aortic valve has an indeterminant number of cusps. Aortic valve regurgitation is not visualized. No aortic stenosis is present.  5. The inferior vena cava is normal in size with greater than 50% respiratory variability, suggesting right atrial pressure of 3 mmHg.  6. Cannot exclude a small PFO. Comparison(s): No prior Echocardiogram. FINDINGS  Left Ventricle: Left ventricular ejection fraction, by estimation, is 60 to 65%. The left ventricle has normal function. The left ventricle has no regional wall motion abnormalities. The left ventricular internal cavity size was normal in size. There is  mild left ventricular hypertrophy. Left ventricular diastolic parameters are consistent with Grade I diastolic dysfunction (impaired relaxation). Normal left ventricular filling pressure. Right Ventricle: The right ventricular size is normal. No increase in right ventricular wall thickness. Right ventricular systolic function is normal. There is normal pulmonary artery systolic pressure. The tricuspid regurgitant velocity is 2.60 m/s, and  with an assumed right atrial pressure of 3 mmHg, the estimated right ventricular systolic pressure is 30.0 mmHg. Left Atrium: Left atrial size was normal in size. Right Atrium: Right atrial size was normal in size. Pericardium: Trivial pericardial effusion is present. Mitral Valve: The mitral valve is normal in  structure. No evidence of mitral valve regurgitation. No evidence of mitral valve stenosis. Tricuspid Valve: The tricuspid valve is grossly normal. Tricuspid valve regurgitation is trivial. No evidence of tricuspid stenosis. Aortic Valve: The aortic valve has an indeterminant number of cusps. Aortic valve regurgitation is not visualized. No aortic stenosis is present. Aortic valve peak gradient measures 10.9 mmHg. Pulmonic Valve: The pulmonic valve was not well visualized. Pulmonic valve regurgitation is not visualized. No evidence of pulmonic stenosis. Aorta: The aortic root and ascending aorta are structurally normal, with no evidence of dilitation. Venous: The inferior vena cava is normal in size with greater than 50% respiratory variability, suggesting right atrial pressure of 3 mmHg. IAS/Shunts: Cannot exclude a small PFO.  LEFT VENTRICLE PLAX 2D LVIDd:         4.10 cm   Diastology LVIDs:         2.80 cm   LV e' medial:    5.44 cm/s LV PW:         1.10 cm   LV E/e' medial:  11.5 LV IVS:        1.20 cm   LV e' lateral:   4.57 cm/s LVOT diam:     1.90 cm   LV E/e' lateral: 13.7 LV SV:         47 LV SV Index:   27 LVOT Area:     2.84 cm  RIGHT VENTRICLE             IVC RV S prime:     19.40 cm/s  IVC diam: 1.20 cm TAPSE (M-mode): 1.9 cm LEFT ATRIUM             Index        RIGHT ATRIUM           Index LA diam:        3.30 cm 1.93 cm/m   RA Area:     11.30 cm LA Vol (A2C):   34.6  ml 20.21 ml/m  RA Volume:   22.30 ml  13.03 ml/m LA Vol (A4C):   50.2 ml 29.33 ml/m LA Biplane Vol: 45.5 ml 26.58 ml/m  AORTIC VALVE AV Area (Vmax): 1.87 cm AV Vmax:        165.00 cm/s AV Peak Grad:   10.9 mmHg LVOT Vmax:      108.60 cm/s LVOT Vmean:     68.920 cm/s LVOT VTI:       0.164 m  AORTA Ao Root diam: 2.80 cm Ao Asc diam:  3.20 cm MITRAL VALVE                TRICUSPID VALVE MV Area (PHT): 3.93 cm     TR Peak grad:   27.0 mmHg MV Decel Time: 193 msec     TR Vmax:        260.00 cm/s MV E velocity: 62.80 cm/s MV A velocity:  135.00 cm/s  SHUNTS MV E/A ratio:  0.47         Systemic VTI:  0.16 m                             Systemic Diam: 1.90 cm Vishnu Priya Mallipeddi Electronically signed by Winfield Rast Mallipeddi Signature Date/Time: 06/26/2023/12:44:00 PM    Final    CT ANGIO HEAD NECK W WO CM  Result Date: 06/25/2023 CLINICAL DATA:  Vertigo, infarcts seen on MRI EXAM: CT ANGIOGRAPHY HEAD AND NECK WITH AND WITHOUT CONTRAST TECHNIQUE: Multidetector CT imaging of the head and neck was performed using the standard protocol during bolus administration of intravenous contrast. Multiplanar CT image reconstructions and MIPs were obtained to evaluate the vascular anatomy. Carotid stenosis measurements (when applicable) are obtained utilizing NASCET criteria, using the distal internal carotid diameter as the denominator. RADIATION DOSE REDUCTION: This exam was performed according to the departmental dose-optimization program which includes automated exposure control, adjustment of the mA and/or kV according to patient size and/or use of iterative reconstruction technique. CONTRAST:  75mL OMNIPAQUE IOHEXOL 350 MG/ML SOLN COMPARISON:  Same-day brain MRI/MRA FINDINGS: CT HEAD FINDINGS Brain: The known left pontine/middle cerebellar peduncle infarct is better seen on the same-day MRI. There is no associated hemorrhage or mass effect. There is no other evidence of acute infarct. There is no acute intracranial hemorrhage or extra-axial fluid collection. Parenchymal volume is normal. The ventricles are normal in size. The pituitary and suprasellar region are normal. There is no mass lesion. There is no mass effect or midline shift. Vascular: See below. Skull: Normal. Negative for fracture or focal lesion. Sinuses/Orbits: The paranasal sinuses are clear. The globes and orbits are unremarkable. Other: The mastoid air cells and middle ear cavities are clear. Review of the MIP images confirms the above findings CTA NECK FINDINGS Aortic arch: The  imaged aortic arch is normal. The origins of the major branch vessels are patent. The subclavian arteries are patent to the level imaged. Right carotid system: The right common, internal, and external carotid arteries are patent, without hemodynamically significant stenosis or occlusion. There is no evidence of dissection or aneurysm. Left carotid system: The left common, internal, and external carotid arteries are patent, without hemodynamically significant stenosis or occlusion. There is no evidence of dissection or aneurysm. Vertebral arteries: The vertebral arteries are patent, without hemodynamically significant stenosis or occlusion. There is no evidence of dissection or aneurysm. Skeleton: There is no acute osseous abnormality or suspicious osseous lesion. There is no visible  canal hematoma. Other neck: The soft tissues of the neck are unremarkable. Upper chest: The imaged lung apices are clear. Review of the MIP images confirms the above findings CTA HEAD FINDINGS Anterior circulation: There is a mild atherosclerotic irregularity and narrowing of the intracranial ICAs. There is mild atherosclerotic irregularity and narrowing of the M1 segments bilaterally. There is no proximal high-grade stenosis or occlusion. The bilateral ACAS are patent, without proximal high-grade stenosis or occlusion. The anterior communicating artery is not definitely seen. There is no aneurysm or AVM. Posterior circulation: The bilateral V4 segments are patent. The basilar artery is patent with mild atherosclerotic irregularity and narrowing distally. The major cerebellar arteries including the left PICA and PICA appear patent. The bilateral PCAs are patent, with mild multifocal atherosclerotic irregularity and narrowing bilaterally but no proximal high-grade stenosis or occlusion. There is no aneurysm or AVM. Venous sinuses: Patent. Anatomic variants: None. Review of the MIP images confirms the above findings IMPRESSION: 1. The  major cerebellar arteries including the left PICA and PICA appear patent. No acute vascular finding. 2. Mild intracranial atherosclerotic irregularity and narrowing of the anterior and posterior circulations without proximal high-grade stenosis or occlusion. No significant atherosclerotic disease in the neck. Electronically Signed   By: Lesia Hausen M.D.   On: 06/25/2023 17:11   MR BRAIN WO CONTRAST  Result Date: 06/25/2023 CLINICAL DATA:  Neuro deficit, acute, stroke suspected. EXAM: MRI HEAD WITHOUT CONTRAST MRA HEAD WITHOUT CONTRAST TECHNIQUE: Multiplanar, multi-echo pulse sequences of the brain and surrounding structures were acquired without intravenous contrast. Angiographic images of the Circle of Willis were acquired using MRA technique without intravenous contrast. COMPARISON:  None Available. FINDINGS: MRI HEAD FINDINGS Brain: Acute infarct in the left lateral pons and middle cerebellar peduncle (axial image 11 series 6). No acute hemorrhage or significant mass effect. Background of mild chronic small-vessel disease. No hydrocephalus or extra-axial collection. No mass effect or midline shift. Vascular: Normal flow voids. Skull and upper cervical spine: Normal marrow signal. Sinuses/Orbits: No acute or significant finding. Other: None. MRA HEAD FINDINGS Anterior circulation: Intracranial ICAs are patent without stenosis or aneurysm. The proximal ACAs and MCAs are patent without stenosis or aneurysm. Distal branches are symmetric. Posterior circulation: Visualized portions of the distal vertebral arteries and basilar artery are patent without stenosis or aneurysm. The left AICA and PICA are not visualized. The PCAs are patent proximally without stenosis or aneurysm. Distal branches are symmetric. Anatomic variants: None. IMPRESSION: 1. Acute infarct in the left lateral pons and middle cerebellar peduncle. No acute hemorrhage or significant mass effect. 2. The left AICA and PICA are not visualized, which  may be due to occlusion or slow flow. Consider CTA head and neck. Electronically Signed   By: Orvan Falconer M.D.   On: 06/25/2023 14:22   MR ANGIO HEAD WO CONTRAST  Result Date: 06/25/2023 CLINICAL DATA:  Neuro deficit, acute, stroke suspected. EXAM: MRI HEAD WITHOUT CONTRAST MRA HEAD WITHOUT CONTRAST TECHNIQUE: Multiplanar, multi-echo pulse sequences of the brain and surrounding structures were acquired without intravenous contrast. Angiographic images of the Circle of Willis were acquired using MRA technique without intravenous contrast. COMPARISON:  None Available. FINDINGS: MRI HEAD FINDINGS Brain: Acute infarct in the left lateral pons and middle cerebellar peduncle (axial image 11 series 6). No acute hemorrhage or significant mass effect. Background of mild chronic small-vessel disease. No hydrocephalus or extra-axial collection. No mass effect or midline shift. Vascular: Normal flow voids. Skull and upper cervical spine: Normal marrow signal. Sinuses/Orbits: No  acute or significant finding. Other: None. MRA HEAD FINDINGS Anterior circulation: Intracranial ICAs are patent without stenosis or aneurysm. The proximal ACAs and MCAs are patent without stenosis or aneurysm. Distal branches are symmetric. Posterior circulation: Visualized portions of the distal vertebral arteries and basilar artery are patent without stenosis or aneurysm. The left AICA and PICA are not visualized. The PCAs are patent proximally without stenosis or aneurysm. Distal branches are symmetric. Anatomic variants: None. IMPRESSION: 1. Acute infarct in the left lateral pons and middle cerebellar peduncle. No acute hemorrhage or significant mass effect. 2. The left AICA and PICA are not visualized, which may be due to occlusion or slow flow. Consider CTA head and neck. Electronically Signed   By: Orvan Falconer M.D.   On: 06/25/2023 14:22     Discharge Exam: Vitals:   06/27/23 0838 06/27/23 1225  BP: (!) 154/74 (!) 166/83  Pulse:  (!) 120 (!) 113  Resp:    Temp: 99.1 F (37.3 C)   SpO2: 99% 100%   Vitals:   06/27/23 0100 06/27/23 0459 06/27/23 0838 06/27/23 1225  BP: (!) 196/95 (!) 186/93 (!) 154/74 (!) 166/83  Pulse: (!) 113 (!) 113 (!) 120 (!) 113  Resp: (!) 21 (!) 21    Temp: 98.5 F (36.9 C) 99.2 F (37.3 C) 99.1 F (37.3 C)   TempSrc: Oral  Oral   SpO2: 98% 99% 99% 100%    General: Pt is alert, awake, not in acute distress Cardiovascular: RRR, S1/S2 +, no rubs, no gallops Respiratory: CTA bilaterally, no wheezing, no rhonchi Abdominal: Soft, NT, ND, bowel sounds + Extremities: no edema, no cyanosis    The results of significant diagnostics from this hospitalization (including imaging, microbiology, ancillary and laboratory) are listed below for reference.     Microbiology: No results found for this or any previous visit (from the past 240 hour(s)).   Labs: BNP (last 3 results) No results for input(s): "BNP" in the last 8760 hours. Basic Metabolic Panel: Recent Labs  Lab 06/25/23 0958  NA 137  K 4.1  CL 102  CO2 23  GLUCOSE 230*  BUN 10  CREATININE 0.88  CALCIUM 9.6   Liver Function Tests: Recent Labs  Lab 06/25/23 0958  AST 25  ALT 22  ALKPHOS 96  BILITOT 1.1  PROT 8.1  ALBUMIN 4.5   No results for input(s): "LIPASE", "AMYLASE" in the last 168 hours. No results for input(s): "AMMONIA" in the last 168 hours. CBC: Recent Labs  Lab 06/25/23 0958  WBC 4.3  NEUTROABS 2.7  HGB 14.4  HCT 42.3  MCV 87.9  PLT 201   Cardiac Enzymes: No results for input(s): "CKTOTAL", "CKMB", "CKMBINDEX", "TROPONINI" in the last 168 hours. BNP: Invalid input(s): "POCBNP" CBG: Recent Labs  Lab 06/25/23 0952 06/26/23 1734 06/26/23 2112 06/27/23 0735 06/27/23 1138  GLUCAP 219* 216* 217* 238* 219*   D-Dimer No results for input(s): "DDIMER" in the last 72 hours. Hgb A1c Recent Labs    06/25/23 0958  HGBA1C 13.3*   Lipid Profile Recent Labs    06/26/23 0456  CHOL 241*   HDL 74  LDLCALC 146*  TRIG 104  CHOLHDL 3.3   Thyroid function studies No results for input(s): "TSH", "T4TOTAL", "T3FREE", "THYROIDAB" in the last 72 hours.  Invalid input(s): "FREET3" Anemia work up No results for input(s): "VITAMINB12", "FOLATE", "FERRITIN", "TIBC", "IRON", "RETICCTPCT" in the last 72 hours. Urinalysis    Component Value Date/Time   COLORURINE STRAW (A) 06/25/2023 5621  APPEARANCEUR CLEAR 06/25/2023 0958   LABSPEC 1.005 06/25/2023 0958   PHURINE 5.0 06/25/2023 0958   GLUCOSEU 50 (A) 06/25/2023 0958   HGBUR NEGATIVE 06/25/2023 0958   BILIRUBINUR NEGATIVE 06/25/2023 0958   KETONESUR NEGATIVE 06/25/2023 0958   PROTEINUR NEGATIVE 06/25/2023 0958   NITRITE NEGATIVE 06/25/2023 0958   LEUKOCYTESUR NEGATIVE 06/25/2023 0958   Sepsis Labs Recent Labs  Lab 06/25/23 0958  WBC 4.3   Microbiology No results found for this or any previous visit (from the past 240 hour(s)).   Time coordinating discharge: 35 minutes  SIGNED:   Erick Blinks, DO [facility] 06/27/2023, 2:09 PM  If 7PM-7AM, please contact night-coverage [email or web address]

## 2023-06-27 NOTE — Plan of Care (Signed)
Problem: Health Behavior/Discharge Planning: Goal: Ability to manage health-related needs will improve Outcome: Completed/Met

## 2023-06-27 NOTE — Inpatient Diabetes Management (Signed)
Inpatient Diabetes Program Recommendations  AACE/ADA: New Consensus Statement on Inpatient Glycemic Control (2015)  Target Ranges:  Prepandial:   less than 140 mg/dL      Peak postprandial:   less than 180 mg/dL (1-2 hours)      Critically ill patients:  140 - 180 mg/dL   Lab Results  Component Value Date   GLUCAP 238 (H) 06/27/2023   HGBA1C 13.3 (H) 06/25/2023    Latest Reference Range & Units 06/25/23 09:52 06/26/23 17:34 06/26/23 21:12 06/27/23 07:35  Glucose-Capillary 70 - 99 mg/dL 119 (H) 147 (H) 829 (H) 238 (H)   Diabetes history: New Onset DM2 Current orders for Inpatient glycemic control: Novolog 0-15 units tid, 0-5 units hs correction  Note: Glucose trends consistently elevated in the 200 range on current Novolog regimen  Inpatient Diabetes Program Recommendations:    -   Start Semglee 10 units  Diabetes education started on 9/20. Nursing staff to walk pt through giving insulin and checking CBG prior to going home. Diabetes Coordinator works remotely over the weekends but is available by pager and phone.  Thanks,  Christena Deem RN, MSN, BC-ADM Inpatient Diabetes Coordinator Team Pager [phone] (8a-5p)

## 2023-06-27 NOTE — Discharge Instructions (Addendum)
An outpatient referral was sent to a facility for the following services: Speech Language Pathologist. A representative from the facility will follow up with you post discharge.

## 2023-06-27 NOTE — TOC Transition Note (Addendum)
Transition of Care [facility]) - CM/SW Discharge Note   Patient Details  Name: Katrina Hodge MRN: [id number] Date of Birth: 1956/07/10  Transition of Care [facility]) CM/SW Contact:  Princella Ion, LCSW Phone Number: 06/27/2023, 3:52 PM   Clinical Narrative:    Notified by MD of HH orders: SLP.  CSW has outreached to [place], [place], [facility], and [facility].   [facility] and [facility] reports they are out of network. No response from [facility] or [facility].   Given that pt's discharge summary is in, [facility] will complete an outpatient referral for SLP. No further TOC needs.      Barriers to Discharge: Barriers Resolved   Patient Goals and CMS Choice      Discharge Placement                         Discharge Plan and Services Additional resources added to the After Visit Summary for                                       Social Determinants of Health (SDOH) Interventions SDOH Screenings   Food Insecurity: No Food Insecurity (06/25/2023)  Housing: Low Risk  (06/25/2023)  Transportation Needs: No Transportation Needs (06/25/2023)  Utilities: Not At Risk (06/25/2023)  Tobacco Use: Low Risk  (06/25/2023)     Readmission Risk Interventions     No data to display

## 2023-06-29 NOTE — Progress Notes (Signed)
Post dc note: CM spoke with Amy with [facility] who conforms agency has determined that they can service patient for [facility] SLP.

## 2023-07-03 ENCOUNTER — Ambulatory Visit: Payer: 59 | Attending: Cardiology

## 2023-07-03 DIAGNOSIS — I499 Cardiac arrhythmia, unspecified: Secondary | ICD-10-CM

## 2023-07-03 DIAGNOSIS — I639 Cerebral infarction, unspecified: Secondary | ICD-10-CM | POA: Diagnosis not present

## 2023-07-23 ENCOUNTER — Encounter: Payer: 59 | Attending: Internal Medicine | Admitting: Dietician

## 2023-07-23 ENCOUNTER — Encounter: Payer: Self-pay | Admitting: Dietician

## 2023-07-23 DIAGNOSIS — E119 Type 2 diabetes mellitus without complications: Secondary | ICD-10-CM | POA: Insufficient documentation

## 2023-07-23 DIAGNOSIS — E0800 Diabetes mellitus due to underlying condition with hyperosmolarity without nonketotic hyperglycemic-hyperosmolar coma (NKHHC): Secondary | ICD-10-CM | POA: Insufficient documentation

## 2023-07-23 DIAGNOSIS — Z713 Dietary counseling and surveillance: Secondary | ICD-10-CM | POA: Diagnosis not present

## 2023-07-23 NOTE — Progress Notes (Signed)
Diabetes Self-Management Education  Visit Type: First/Initial  Appt. Start Time: 9:20  Appt. End Time: 10:35  07/23/2023  Katrina Hodge, identified by name and date of birth, is a 67 y.o. female with a diagnosis of Diabetes: Type 2.   ASSESSMENT  Patient is here today with her husband. Patient lives with her husband and they share shopping and cooking. Patient would like to learn the right kinds of foods to eat. Patient reports she mas made many lifestyle changes since diagnosis in September of 2024. Pt reports she is currently self monitoring blood sugar three times daily with recent values ranging "90-110" mg/dL. All Pt's qustions were answered during this encounter.  History includes:   Past Medical History:  Diagnosis Date   Essential hypertension 10/08/2016    Labs noted:   Lab Results  Component Value Date   HGBA1C 13.3 (H) 06/25/2023   BP Readings from Last 3 Encounters:  06/27/23 (!) 180/97  01/06/17 (!) 178/90  10/08/16 (!) 162/92   Lab Results  Component Value Date   CHOL 241 (H) 06/26/2023   HDL 74 06/26/2023   LDLCALC 146 (H) 06/26/2023   TRIG 104 06/26/2023   CHOLHDL 3.3 06/26/2023   Medications include:   Current Outpatient Medications:    amLODipine (NORVASC) 5 MG tablet, Take 1 tablet (5 mg total) by mouth daily., Disp: 30 tablet, Rfl: 1   aspirin 81 MG chewable tablet, Chew 1 tablet (81 mg total) by mouth daily., Disp: 30 tablet, Rfl: 1   atorvastatin (LIPITOR) 40 MG tablet, Take 1 tablet (40 mg total) by mouth daily., Disp: 30 tablet, Rfl: 1   Blood Glucose Monitoring Suppl DEVI, 1 each by Does not apply route in the morning, at noon, and at bedtime. May substitute to any manufacturer covered by patient's insurance., Disp: 1 each, Rfl: 0   Cholecalciferol (VITAMIN D3) 25 MCG (1000 UT) CAPS, Take 2,000 Units by mouth daily., Disp: , Rfl:    fluticasone (FLONASE) 50 MCG/ACT nasal spray, Place 1 spray into both nostrils daily., Disp: , Rfl:     Glucose Blood (BLOOD GLUCOSE TEST STRIPS) STRP, 1 each by In Vitro route in the morning, at noon, and at bedtime. May substitute to any manufacturer covered by patient's insurance., Disp: 100 strip, Rfl: 0   Lancet Device MISC, 1 each by Does not apply route in the morning, at noon, and at bedtime. May substitute to any manufacturer covered by patient's insurance., Disp: 1 each, Rfl: 0   Lancets Misc. MISC, 1 each by Does not apply route in the morning, at noon, and at bedtime. May substitute to any manufacturer covered by patient's insurance., Disp: 100 each, Rfl: 0   metFORMIN (GLUCOPHAGE) 500 MG tablet, Take 1 tablet (500 mg total) by mouth 2 (two) times daily with a meal., Disp: 60 tablet, Rfl: 1   ibuprofen (ADVIL,MOTRIN) 200 MG tablet, Take 200 mg by mouth every 6 (six) hours as needed. (Patient not taking: Reported on 07/23/2023), Disp: , Rfl:    losartan (COZAAR) 100 MG tablet, Take 100 mg by mouth daily. (Patient not taking: Reported on 07/23/2023), Disp: , Rfl:    ondansetron (ZOFRAN) 4 MG tablet, Take 1 tablet (4 mg total) by mouth daily as needed for nausea or vomiting., Disp: 30 tablet, Rfl: 1   There were no vitals taken for this visit. There is no height or weight on file to calculate BMI.   Diabetes Self-Management Education - 07/23/23 0915  Visit Information   Visit Type First/Initial      Initial Visit   Diabetes Type Type 2    Date Diagnosed 06/27/2023    Are you currently following a meal plan? No    Are you taking your medications as prescribed? Yes      Health Coping   How would you rate your overall health? Good      Psychosocial Assessment   Patient Belief/Attitude about Diabetes Motivated to manage diabetes    What is the hardest part about your diabetes right now, causing you the most concern, or is the most worrisome to you about your diabetes?   Checking blood sugar;Making healty food and beverage choices    Self-care barriers None    Self-management  support Doctor's office    Other persons present Patient    Special Needs None    Preferred Learning Style No preference indicated    How often do you need to have someone help you when you read instructions, pamphlets, or other written materials from your doctor or pharmacy? 1 - Never    What is the last grade level you completed in school? 14      Dietary Intake   Breakfast 1eggs, avocado spread, tortilla, 1/2 slice whole grain bread, coffee plain, water, tea with honey sometimes    Snack (morning) apple or 1/2 bannaa, nuts-unsalted    Lunch salad with grilled chicken, dressing, nuts, apples, water    Snack (afternoon) popcorn, SF jello or yogurt    Dinner bran cereal, cocnut or almond milk unsweet or veggie burger patty, cheese    Snack (evening) nuts, apple or berries    Beverage(s) water, tea plain, coconut or almond milk      Activity / Exercise   How many days per week do you exercise? 15    How many minutes per day do you exercise? 4    Total minutes per week of exercise 60      Individualized Goals (developed by patient)   Nutrition Follow meal plan discussed    Physical Activity Exercise 3-5 times per week;15 minutes per day;30 minutes per day    Medications take my medication as prescribed    Monitoring  Test my blood glucose as discussed    Problem Solving Social situations    Reducing Risk examine blood glucose patterns;do foot checks daily    Health Coping Discuss barriers to diabetes care with support person/system (comment specifics as needed)      Post-Education Assessment   Patient understands the diabetes disease and treatment process. Needs Instruction    Patient understands incorporating nutritional management into lifestyle. Needs Instruction    Patient undertands incorporating physical activity into lifestyle. Needs Instruction    Patient understands using medications safely. Needs Instruction    Patient understands monitoring blood glucose, interpreting and  using results Needs Instruction    Patient understands prevention, detection, and treatment of acute complications. Needs Instruction    Patient understands prevention, detection, and treatment of chronic complications. Needs Instruction    Patient understands how to develop strategies to address psychosocial issues. Needs Instruction    Patient understands how to develop strategies to promote health/change behavior. Needs Instruction      Outcomes   Expected Outcomes Demonstrated interest in learning. Expect positive outcomes    Future DMSE 2 months    Program Status Not Completed             Individualized Plan for Diabetes Self-Management Training:  Learning Objective:  Patient will have a greater understanding of diabetes self-management. Patient education plan is to attend individual and/or group sessions per assessed needs and concerns.   Plan:   Patient Instructions  1-Try the plate planner for meal times  1/2 plate of non starchy vegetables, 1/2 protein, 1/4 starchy 2-Great Job with physical activity  Aim for 150 minutes weekly of moderate physical activity  Expected Outcomes:  Demonstrated interest in learning. Expect positive outcomes  Education material provided: ADA - How to Thrive: A Guide for Your Journey with Diabetes, My Plate, Snack sheet, No sodium seasonings, and Diabetes Resources  If problems or questions, patient to contact team via:  Phone  Future DSME appointment: 2 months

## 2023-07-23 NOTE — Patient Instructions (Signed)
1-Try the plate planner for meal times  1/2 plate of non starchy vegetables, 1/2 protein, 1/4 starchy 2-Great Job with physical activity  Aim for 150 minutes weekly of moderate physical activity

## 2023-09-01 ENCOUNTER — Encounter: Payer: Self-pay | Admitting: Neurology

## 2023-09-01 ENCOUNTER — Ambulatory Visit: Payer: 59 | Admitting: Neurology

## 2023-09-01 VITALS — BP 128/82 | Ht 62.0 in | Wt 128.0 lb

## 2023-09-01 DIAGNOSIS — I6381 Other cerebral infarction due to occlusion or stenosis of small artery: Secondary | ICD-10-CM | POA: Diagnosis not present

## 2023-09-01 DIAGNOSIS — I639 Cerebral infarction, unspecified: Secondary | ICD-10-CM

## 2023-09-01 NOTE — Progress Notes (Signed)
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  Patient reports 2 weeks prior to presentation she was experiencing severe vertigo described as room spinning sensation with nausea and vomiting.  She has seen ENT, has audiogram done and was scheduled for additional test in the future.  She presented to her PCP due to elevated blood pressure and blurry vision and was directed to the ED.  In the ED, MRI showed an acute left lateral pontine stroke.  At that time she was also diagnosed with diabetes with A1c of 13.  She recovered well from the stroke perspective, her main deficit noted now is right facial drooping which is symmetric on activation.  She completed DAPT, for 21 days and currently on aspirin alone.  She also completed a 30-day cardiac monitor which showed no evidence of atrial fibrillation.   Hospital Summary  Katrina Hodge is a 67 y.o. female with medical history significant for hypertension presented to the ED from PCP office with elevated blood pressures, blurred vision, and some facial droop.  She has had vertigo symptoms over the past several weeks and for the past few days has had some intermittent aphasia as well as dysarthria.  She has been admitted with for acute left lateral pontine CVA and is now also noted to be diabetic with A1c 13.3%.  She underwent diabetes education and was noted to have elevated  blood pressure readings and therefore remain on other night until blood pressures were better controlled.  She was also noted to have some intermittent nausea and vomiting which have now improved.  She will be started on medications as noted above with close follow-up.  2D echocardiogram with no acute findings and she will be set up with 30-day event monitor.  No other acute events or concerns noted throughout the course of the stay.   OTHER MEDICAL CONDITIONS: Hypertension, Hyperlipidemia, Diabetes    REVIEW OF SYSTEMS: Full 14 system review of systems performed and negative with exception of: As noted in the HPI   ALLERGIES: Allergies  Allergen Reactions   Levaquin [Levofloxacin In D5w]     rash    HOME MEDICATIONS: Outpatient Medications Prior to Visit  Medication Sig Dispense Refill   aspirin EC 81 MG tablet Take 81 mg by mouth daily. Swallow whole.     atorvastatin (LIPITOR) 40 MG tablet Take 1 tablet (40 mg total) by mouth daily. 30 tablet 1   Blood Glucose Monitoring Suppl DEVI 1 each by Does not apply route in the morning, at noon, and at bedtime. May substitute to any manufacturer covered by patient's insurance. 1 each 0   Cholecalciferol (VITAMIN D3) 25 MCG (1000 UT) CAPS Take 2,000 Units by mouth daily.     ibuprofen (ADVIL,MOTRIN) 200 MG tablet Take 200 mg by mouth every 6 (six) hours as needed.     metFORMIN (GLUCOPHAGE) 500 MG tablet Take 1 tablet (500 mg total) by mouth 2 (two) times daily with a  meal. 60 tablet 1   amLODipine (NORVASC) 5 MG tablet Take 1 tablet (5 mg total) by mouth daily. 30 tablet 1   fluticasone (FLONASE) 50 MCG/ACT nasal spray Place 1 spray into both nostrils daily.     losartan (COZAAR) 100 MG tablet Take 100 mg by mouth daily. (Patient not taking: Reported on 07/23/2023)     ondansetron (ZOFRAN) 4 MG tablet Take 1 tablet (4 mg total) by mouth daily as needed for nausea or vomiting. 30 tablet 1   No facility-administered medications prior to visit.     PAST MEDICAL HISTORY: Past Medical History:  Diagnosis Date   Essential hypertension 10/08/2016   Stroke [facility])     PAST SURGICAL HISTORY: Past Surgical History:  Procedure Laterality Date   PARTIAL HYSTERECTOMY     fibroid tumors    FAMILY HISTORY: Family History  Problem Relation Age of Onset   Dementia Mother    Breast cancer Neg Hx     SOCIAL HISTORY: Social History   Socioeconomic History   Marital status: Married    Spouse name: Not on file   Number of children: Not on file   Years of education: Not on file   Highest education level: Not on file  Occupational History   Not on file  Tobacco Use   Smoking status: Never   Smokeless tobacco: Never  Substance and Sexual Activity   Alcohol use: No   Drug use: No   Sexual activity: Not on file  Other Topics Concern   Not on file  Social History Narrative   Right handed   Caffiene- none   Married and 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   Work at [employer], Print production planner   Social Determinants of Health   Financial Resource Strain: Not on file  Food Insecurity: No Food Insecurity (07/23/2023)   Hunger Vital Sign    Worried About Running Out of Food in the Last Year: Never true    Ran Out of Food in the Last Year: Never true  Transportation Needs: No Transportation Needs (06/25/2023)   PRAPARE - Administrator, Civil Service (Medical): No    Lack of Transportation (Non-Medical): No  Physical Activity: Not on file  Stress: Not on file  Social Connections: Not on file  Intimate Partner Violence: Not At Risk (06/25/2023)   Humiliation, Afraid, Rape, and Kick questionnaire    Fear of Current or Ex-Partner: No    Emotionally Abused: No    Physically Abused: No    Sexually Abused: No    PHYSICAL EXAM  GENERAL EXAM/CONSTITUTIONAL: Vitals:  Vitals:   09/01/23 1024  BP: 128/82  Weight: 128 lb (58.1 kg)  Height: 5\' 2"  (1.575 m)   Body mass index is 23.41 kg/m. Wt Readings from Last 3  Encounters:  09/01/23 128 lb (58.1 kg)  01/06/17 154 lb 3.2 oz (69.9 kg)  10/08/16 153 lb 4.8 oz (69.5 kg)   Patient is in no distress; well developed, nourished and groomed; neck is supple  MUSCULOSKELETAL: Gait, strength, tone, movements noted in Neurologic exam below  NEUROLOGIC: MENTAL STATUS:      No data to display         awake, alert, oriented to person, place and time recent and remote memory intact normal attention and concentration language fluent, comprehension intact, naming intact fund of knowledge appropriate  CRANIAL NERVE:  2nd, 3rd, 4th, 6th - Visual fields full to confrontation, extraocular muscles intact, no nystagmus 5th - facial sensation  symmetric 7th - There is right lower facial weakness 8th - hearing intact 9th - palate elevates symmetrically, uvula midline 11th - shoulder shrug symmetric 12th - tongue protrusion midline  MOTOR:  normal bulk and tone, full strength in the BUE, BLE. There is pronation of the RUE but no drift  SENSORY:  normal and symmetric to light touch  COORDINATION:  finger-nose-finger, fine finger movements normal  GAIT/STATION:  normal   DIAGNOSTIC DATA (LABS, IMAGING, TESTING) - I reviewed patient records, labs, notes, testing and imaging myself where available.  Lab Results  Component Value Date   WBC 4.3 06/25/2023   HGB 14.4 06/25/2023   HCT 42.3 06/25/2023   MCV 87.9 06/25/2023   PLT 201 06/25/2023      Component Value Date/Time   NA 137 06/25/2023 0958   K 4.1 06/25/2023 0958   CL 102 06/25/2023 0958   CO2 23 06/25/2023 0958   GLUCOSE 230 (H) 06/25/2023 0958   BUN 10 06/25/2023 0958   CREATININE 0.88 06/25/2023 0958   CALCIUM 9.6 06/25/2023 0958   PROT 8.1 06/25/2023 0958   ALBUMIN 4.5 06/25/2023 0958   AST 25 06/25/2023 0958   ALT 22 06/25/2023 0958   ALKPHOS 96 06/25/2023 0958   BILITOT 1.1 06/25/2023 0958   GFRNONAA >60 06/25/2023 0958   Lab Results  Component Value Date   CHOL 241 (H)  06/26/2023   HDL 74 06/26/2023   LDLCALC 146 (H) 06/26/2023   TRIG 104 06/26/2023   CHOLHDL 3.3 06/26/2023   Lab Results  Component Value Date   HGBA1C 13.3 (H) 06/25/2023   No results found for: "VITAMINB12" No results found for: "TSH"  MRI Brain/MRA Head and Neck 06/25/2023 1. Acute infarct in the left lateral pons and middle cerebellar peduncle. No acute hemorrhage or significant mass effect. 2. The left AICA and PICA are not visualized, which may be due to occlusion or slow flow. Consider CTA head and neck.  CTA Head and Neck 06/25/2023 1. The major cerebellar arteries including the left PICA and PICA appear patent. No acute vascular finding. 2. Mild intracranial atherosclerotic irregularity and narrowing of the anterior and posterior circulations without proximal high-grade stenosis or occlusion. No significant atherosclerotic disease in the neck  Echo 06/26/2023 1. Left ventricular ejection fraction, by estimation, is 60 to 65%. The left ventricle has normal function. The left ventricle has no regional wall motion abnormalities. There is mild left ventricular hypertrophy.  Left ventricular diastolic parameters are consistent with Grade I diastolic dysfunction (impaired relaxation).  2. Right ventricular systolic function is normal. The right ventricular size is normal. There is normal pulmonary artery systolic pressure.  3. The mitral valve is normal in structure. No evidence of mitral valve regurgitation. No evidence of mitral stenosis.  4. The aortic valve has an indeterminant number of cusps. Aortic valve regurgitation is not visualized. No aortic stenosis is present.  5. The inferior vena cava is normal in size with greater than 50%  respiratory variability, suggesting right atrial pressure of 3 mmHg.  6. Cannot exclude a small PFO.     ASSESSMENT AND PLAN  66 y.o. 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  Stroke etiology likely small vessel disease.  She completed DAPT, aspirin and Plavix for 21 days and currently on aspirin alone.  Advised patient to continue with aspirin monotherapy.  Her LDL  was 146, she is currently on atorvastatin 40 mg daily, patient understands to continue this medication.  Other than the right lower facial weakness she does not have any major deficit, her cardiac monitor did not demonstrate afib.  She has returned to to work.  Plan for now to is to continue following up with PCP and return as needed.   1. Left pontine stroke (HCC)   2. 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   No orders of the defined types were placed in this encounter.   Return if symptoms worsen or fail to improve.  I have spent a total of 50 minutes dedicated to this patient today, preparing to see patient, performing a medically appropriate examination and evaluation, ordering tests and/or medications and procedures, and counseling and educating the patient/family/caregiver; independently interpreting result and communicating results to the family/patient/caregiver; and documenting clinical information in the electronic medical record.   Windell Norfolk, MD 09/01/2023, 12:59 PM  [facility] [address], [address] [place], [place] [address] [phone]

## 2023-09-01 NOTE — Patient Instructions (Signed)
Continue current medications  Continue to follow up with PCP  Increase exercise, at least 20 minutes a day, 5 days a week  Return as needed

## 2023-10-08 ENCOUNTER — Other Ambulatory Visit: Payer: Self-pay | Admitting: Internal Medicine

## 2023-10-08 DIAGNOSIS — Z1231 Encounter for screening mammogram for malignant neoplasm of breast: Secondary | ICD-10-CM

## 2023-10-22 ENCOUNTER — Ambulatory Visit
Admission: RE | Admit: 2023-10-22 | Discharge: 2023-10-22 | Disposition: A | Discharge status: Home / Self Care | Payer: Medicare Other | Source: Ambulatory Visit

## 2023-10-22 DIAGNOSIS — Z1231 Encounter for screening mammogram for malignant neoplasm of breast: Secondary | ICD-10-CM

## 2024-01-04 DIAGNOSIS — T50905A Adverse effect of unspecified drugs, medicaments and biological substances, initial encounter: Secondary | ICD-10-CM | POA: Diagnosis not present

## 2024-01-04 DIAGNOSIS — I6930 Unspecified sequelae of cerebral infarction: Secondary | ICD-10-CM | POA: Diagnosis not present

## 2024-01-04 DIAGNOSIS — I10 Essential (primary) hypertension: Secondary | ICD-10-CM | POA: Diagnosis not present

## 2024-01-04 DIAGNOSIS — Z6823 Body mass index (BMI) 23.0-23.9, adult: Secondary | ICD-10-CM | POA: Diagnosis not present

## 2024-01-04 DIAGNOSIS — R7303 Prediabetes: Secondary | ICD-10-CM | POA: Diagnosis not present

## 2024-05-26 ENCOUNTER — Other Ambulatory Visit (HOSPITAL_COMMUNITY): Payer: Self-pay

## 2024-05-26 DIAGNOSIS — Z7689 Persons encountering health services in other specified circumstances: Secondary | ICD-10-CM | POA: Diagnosis not present

## 2024-05-26 DIAGNOSIS — E1165 Type 2 diabetes mellitus with hyperglycemia: Secondary | ICD-10-CM | POA: Diagnosis not present

## 2024-05-26 DIAGNOSIS — Z8673 Personal history of transient ischemic attack (TIA), and cerebral infarction without residual deficits: Secondary | ICD-10-CM | POA: Diagnosis not present

## 2024-05-26 DIAGNOSIS — Z13820 Encounter for screening for osteoporosis: Secondary | ICD-10-CM

## 2024-05-26 DIAGNOSIS — E782 Mixed hyperlipidemia: Secondary | ICD-10-CM | POA: Diagnosis not present

## 2024-05-26 DIAGNOSIS — Z7984 Long term (current) use of oral hypoglycemic drugs: Secondary | ICD-10-CM | POA: Diagnosis not present

## 2024-05-26 DIAGNOSIS — I10 Essential (primary) hypertension: Secondary | ICD-10-CM | POA: Diagnosis not present

## 2024-05-26 DIAGNOSIS — Z79899 Other long term (current) drug therapy: Secondary | ICD-10-CM | POA: Diagnosis not present

## 2024-06-12 DIAGNOSIS — W57XXXA Bitten or stung by nonvenomous insect and other nonvenomous arthropods, initial encounter: Secondary | ICD-10-CM | POA: Diagnosis not present

## 2024-06-12 DIAGNOSIS — R030 Elevated blood-pressure reading, without diagnosis of hypertension: Secondary | ICD-10-CM | POA: Diagnosis not present

## 2024-06-12 DIAGNOSIS — T148XXA Other injury of unspecified body region, initial encounter: Secondary | ICD-10-CM | POA: Diagnosis not present

## 2024-06-21 DIAGNOSIS — E1165 Type 2 diabetes mellitus with hyperglycemia: Secondary | ICD-10-CM | POA: Diagnosis not present

## 2024-06-27 DIAGNOSIS — E1165 Type 2 diabetes mellitus with hyperglycemia: Secondary | ICD-10-CM | POA: Diagnosis not present

## 2024-06-27 DIAGNOSIS — I10 Essential (primary) hypertension: Secondary | ICD-10-CM | POA: Diagnosis not present

## 2024-06-27 DIAGNOSIS — J302 Other seasonal allergic rhinitis: Secondary | ICD-10-CM | POA: Diagnosis not present

## 2024-06-27 DIAGNOSIS — E782 Mixed hyperlipidemia: Secondary | ICD-10-CM | POA: Diagnosis not present

## 2024-06-27 DIAGNOSIS — Z7689 Persons encountering health services in other specified circumstances: Secondary | ICD-10-CM | POA: Diagnosis not present

## 2024-06-27 DIAGNOSIS — Z7984 Long term (current) use of oral hypoglycemic drugs: Secondary | ICD-10-CM | POA: Diagnosis not present

## 2024-06-27 DIAGNOSIS — Z79899 Other long term (current) drug therapy: Secondary | ICD-10-CM | POA: Diagnosis not present

## 2024-08-31 ENCOUNTER — Other Ambulatory Visit: Payer: Self-pay | Admitting: Internal Medicine

## 2024-08-31 DIAGNOSIS — Z1231 Encounter for screening mammogram for malignant neoplasm of breast: Secondary | ICD-10-CM

## 2024-10-25 ENCOUNTER — Ambulatory Visit
Admission: RE | Admit: 2024-10-25 | Discharge: 2024-10-25 | Disposition: A | Source: Ambulatory Visit | Attending: Internal Medicine | Admitting: Internal Medicine

## 2024-10-25 DIAGNOSIS — Z1231 Encounter for screening mammogram for malignant neoplasm of breast: Secondary | ICD-10-CM
